# Patient Record
Sex: Male | Born: 1985 | Race: Black or African American | Hispanic: No | Marital: Single | Smoking: Current every day smoker
Health system: Southern US, Community
[De-identification: ages and names within clinical notes are randomized; demographics above are authoritative.]

---

## 2010-07-18 ENCOUNTER — Emergency Department (HOSPITAL_COMMUNITY)
Admission: EM | Admit: 2010-07-18 | Discharge: 2010-07-18 | Payer: Self-pay | Source: Home / Self Care | Admitting: Emergency Medicine

## 2010-10-20 ENCOUNTER — Emergency Department (HOSPITAL_COMMUNITY)
Admission: EM | Admit: 2010-10-20 | Discharge: 2010-10-20 | Disposition: A | Discharge status: Home / Self Care | Payer: Self-pay | Attending: Emergency Medicine | Admitting: Emergency Medicine

## 2010-10-20 DIAGNOSIS — K047 Periapical abscess without sinus: Secondary | ICD-10-CM | POA: Insufficient documentation

## 2010-10-20 DIAGNOSIS — K029 Dental caries, unspecified: Secondary | ICD-10-CM | POA: Insufficient documentation

## 2010-10-20 DIAGNOSIS — K089 Disorder of teeth and supporting structures, unspecified: Secondary | ICD-10-CM | POA: Insufficient documentation

## 2010-12-13 ENCOUNTER — Emergency Department (HOSPITAL_COMMUNITY)
Admission: EM | Admit: 2010-12-13 | Discharge: 2010-12-13 | Disposition: A | Discharge status: Home / Self Care | Payer: Self-pay | Attending: Emergency Medicine | Admitting: Emergency Medicine

## 2010-12-13 DIAGNOSIS — K029 Dental caries, unspecified: Secondary | ICD-10-CM | POA: Insufficient documentation

## 2010-12-13 DIAGNOSIS — K089 Disorder of teeth and supporting structures, unspecified: Secondary | ICD-10-CM | POA: Insufficient documentation

## 2014-10-29 ENCOUNTER — Emergency Department (HOSPITAL_COMMUNITY): Payer: BLUE CROSS/BLUE SHIELD

## 2014-10-29 ENCOUNTER — Encounter (HOSPITAL_COMMUNITY): Payer: Self-pay | Admitting: *Deleted

## 2014-10-29 ENCOUNTER — Emergency Department (HOSPITAL_COMMUNITY)
Admission: EM | Admit: 2014-10-29 | Discharge: 2014-10-29 | Disposition: A | Discharge status: Home / Self Care | Payer: BLUE CROSS/BLUE SHIELD | Attending: Emergency Medicine | Admitting: Emergency Medicine

## 2014-10-29 DIAGNOSIS — R300 Dysuria: Secondary | ICD-10-CM

## 2014-10-29 DIAGNOSIS — A599 Trichomoniasis, unspecified: Secondary | ICD-10-CM

## 2014-10-29 DIAGNOSIS — A5900 Urogenital trichomoniasis, unspecified: Secondary | ICD-10-CM | POA: Diagnosis not present

## 2014-10-29 DIAGNOSIS — N200 Calculus of kidney: Secondary | ICD-10-CM

## 2014-10-29 DIAGNOSIS — Z720 Tobacco use: Secondary | ICD-10-CM | POA: Insufficient documentation

## 2014-10-29 LAB — URINALYSIS, ROUTINE W REFLEX MICROSCOPIC
Bilirubin Urine: NEGATIVE
Glucose, UA: NEGATIVE mg/dL
KETONES UR: NEGATIVE mg/dL
Nitrite: NEGATIVE
PROTEIN: NEGATIVE mg/dL
Specific Gravity, Urine: 1.022 (ref 1.005–1.030)
UROBILINOGEN UA: 0.2 mg/dL (ref 0.0–1.0)
pH: 7 (ref 5.0–8.0)

## 2014-10-29 LAB — COMPREHENSIVE METABOLIC PANEL
ALK PHOS: 96 U/L (ref 39–117)
ALT: 16 U/L (ref 0–53)
AST: 36 U/L (ref 0–37)
Albumin: 4.1 g/dL (ref 3.5–5.2)
Anion gap: 13 (ref 5–15)
BILIRUBIN TOTAL: 1.1 mg/dL (ref 0.3–1.2)
BUN: 13 mg/dL (ref 6–23)
CO2: 23 mmol/L (ref 19–32)
Calcium: 9.6 mg/dL (ref 8.4–10.5)
Chloride: 103 mmol/L (ref 96–112)
Creatinine, Ser: 1.22 mg/dL (ref 0.50–1.35)
GFR, EST NON AFRICAN AMERICAN: 78 mL/min — AB (ref >90)
Glucose, Bld: 92 mg/dL (ref 70–99)
Potassium: 4.9 mmol/L (ref 3.5–5.1)
SODIUM: 139 mmol/L (ref 135–145)
Total Protein: 6.6 g/dL (ref 6.0–8.3)

## 2014-10-29 LAB — CBC WITH DIFFERENTIAL/PLATELET
BASOS ABS: 0.1 10*3/uL (ref 0.0–0.1)
BASOS PCT: 1 % (ref 0–1)
EOS PCT: 3 % (ref 0–5)
Eosinophils Absolute: 0.2 10*3/uL (ref 0.0–0.7)
HEMATOCRIT: 46.4 % (ref 39.0–52.0)
Hemoglobin: 15.6 g/dL (ref 13.0–17.0)
Lymphocytes Relative: 42 % (ref 12–46)
Lymphs Abs: 2.2 10*3/uL (ref 0.7–4.0)
MCH: 31.6 pg (ref 26.0–34.0)
MCHC: 33.6 g/dL (ref 30.0–36.0)
MCV: 93.9 fL (ref 78.0–100.0)
MONO ABS: 0.3 10*3/uL (ref 0.1–1.0)
Monocytes Relative: 5 % (ref 3–12)
NEUTROS ABS: 2.6 10*3/uL (ref 1.7–7.7)
Neutrophils Relative %: 49 % (ref 43–77)
PLATELETS: 218 10*3/uL (ref 150–400)
RBC: 4.94 MIL/uL (ref 4.22–5.81)
RDW: 12.8 % (ref 11.5–15.5)
WBC: 5.3 10*3/uL (ref 4.0–10.5)

## 2014-10-29 LAB — URINE MICROSCOPIC-ADD ON

## 2014-10-29 MED ORDER — AZITHROMYCIN 250 MG PO TABS
1000.0000 mg | ORAL_TABLET | Freq: Once | ORAL | Status: AC
  Administered 2014-10-29: 1000 mg via ORAL
  Filled 2014-10-29: qty 4

## 2014-10-29 MED ORDER — METRONIDAZOLE 500 MG PO TABS: 500.0000 mg | ORAL_TABLET | Freq: Two times a day (BID) | ORAL | Status: DC

## 2014-10-29 MED ORDER — LIDOCAINE HCL (PF) 1 % IJ SOLN
5.0000 mL | Freq: Once | INTRAMUSCULAR | Status: AC
  Administered 2014-10-29: 5 mL
  Filled 2014-10-29: qty 5

## 2014-10-29 MED ORDER — CEPHALEXIN 500 MG PO CAPS: 500.0000 mg | ORAL_CAPSULE | Freq: Four times a day (QID) | ORAL | Status: DC

## 2014-10-29 MED ORDER — CEFTRIAXONE SODIUM 250 MG IJ SOLR
250.0000 mg | Freq: Once | INTRAMUSCULAR | Status: AC
  Administered 2014-10-29: 250 mg via INTRAMUSCULAR
  Filled 2014-10-29: qty 250

## 2014-10-29 NOTE — ED Provider Notes (Signed)
CSN: [id number]     Arrival date & time 10/29/14  1435 History   First MD Initiated Contact with Patient 10/29/14 1521     Chief Complaint  Patient presents with  . Dysuria  . Hematuria     (Consider location/radiation/quality/duration/timing/severity/associated sxs/prior Treatment) HPI Comments: 29 y/o M c/o dysuria and hematuria x 3 days. States he has a sharp pain through his penis that radiates to his suprapubic region and bilateral flank when urinating. Admits to a dull ache intermittently without urination. States he noticed a small amount of blood in his underwear yesterday. Denies testicular pain/swelling, penile discharge. States he is in a monogamous sexual relationship with his girlfriend of 5 months and does not use protection. States STDs are not a concern of his at this time and his symptoms feel like a prior kidney stone. Last kidney stone was 2 years ago. Denies fever, chills, n/v.  The history is provided by the patient.    History reviewed. No pertinent past medical history. History reviewed. No pertinent past surgical history. History reviewed. No pertinent family history. History  Substance Use Topics  . Smoking status: Current Every Day Smoker  . Smokeless tobacco: Never Used  . Alcohol Use: Yes    Review of Systems  Genitourinary: Positive for dysuria and hematuria.  All other systems reviewed and are negative.     Allergies  Review of patient's allergies indicates no known allergies.  Home Medications   Prior to Admission medications   Medication Sig Start Date End Date Taking? Authorizing Provider  cephALEXin (KEFLEX) 500 MG capsule Take 1 capsule (500 mg total) by mouth 4 (four) times daily. 10/29/14   Janica Eldred M Camille Thau, PA-C  metroNIDAZOLE (FLAGYL) 500 MG tablet Take 1 tablet (500 mg total) by mouth 2 (two) times daily. One po bid x 7 days 10/29/14   Katharin Schneider M Lynlee Stratton, PA-C   BP 125/92 mmHg  Pulse 77  Temp(Src) 98.3 F (36.8 C) (Oral)  Resp 16  SpO2  99% Physical Exam  Constitutional: He is oriented to person, place, and time. He appears well-developed and well-nourished. No distress.  HENT:  Head: Normocephalic and atraumatic.  Eyes: Conjunctivae and EOM are normal.  Neck: Normal range of motion. Neck supple.  Cardiovascular: Normal rate, regular rhythm and normal heart sounds.   Pulmonary/Chest: Effort normal and breath sounds normal.  Abdominal: Soft. Bowel sounds are normal. He exhibits no distension. There is no tenderness.  No CVAT.  Genitourinary: Right testis shows no mass, no swelling and no tenderness. Left testis shows no mass, no swelling and no tenderness. Circumcised. No penile erythema or penile tenderness. No discharge found.  Musculoskeletal: Normal range of motion. He exhibits no edema.  Neurological: He is alert and oriented to person, place, and time.  Skin: Skin is warm and dry.  Psychiatric: He has a normal mood and affect. His behavior is normal.  Nursing note and vitals reviewed.   ED Course  Procedures (including critical care time) Labs Review Labs Reviewed  URINALYSIS, ROUTINE W REFLEX MICROSCOPIC - Abnormal; Notable for the following:    APPearance CLOUDY (*)    Hgb urine dipstick MODERATE (*)    Leukocytes, UA MODERATE (*)    All other components within normal limits  COMPREHENSIVE METABOLIC PANEL - Abnormal; Notable for the following:    GFR calc non Af Amer 78 (*)    All other components within normal limits  URINE MICROSCOPIC-ADD ON - Abnormal; Notable for the following:    Bacteria,  UA MANY (*)    All other components within normal limits  URINE CULTURE  CBC WITH DIFFERENTIAL/PLATELET  GC/CHLAMYDIA PROBE AMP (Nunez)    Imaging Review Ct Abdomen Pelvis Wo Contrast  10/29/2014   CLINICAL DATA:  Pain during urination for 3 days.  EXAM: CT ABDOMEN AND PELVIS WITHOUT CONTRAST  TECHNIQUE: Multidetector CT imaging of the abdomen and pelvis was performed following the standard protocol  without IV contrast.  COMPARISON:  None.  FINDINGS: BODY WALL: No contributory findings.  LOWER CHEST: No contributory findings.  ABDOMEN/PELVIS:  Liver: No focal abnormality.  Biliary: No evidence of biliary obstruction or stone.  Pancreas: Unremarkable.  Spleen: Unremarkable.  Adrenals: Unremarkable.  Kidneys and ureters: Punctate stone present in the upper pole right kidney. No hydronephrosis or ureteral calculus.  Bladder: Unremarkable.  Reproductive: No pathologic findings.  Bowel: Few colonic diverticula without active inflammation. No bowel obstruction. Normal appendix.  Retroperitoneum: No mass or adenopathy.  Peritoneum: No ascites or pneumoperitoneum.  Vascular: No acute abnormality.  OSSEOUS: No acute abnormalities.  IMPRESSION: 1. No explanation for dysuria. 2. Punctate right nephrolithiasis. 3. Few colonic diverticula.   Electronically Signed   By: Marnee Spring M.D.   On: 10/29/2014 17:24     EKG Interpretation None      MDM   Final diagnoses:  Dysuria  Trichomonas infection  Right nephrolithiasis   Nontoxic appearing, NAD. AF VSS. Abdomen soft and nontender. GU exam normal. CT scan obtained to evaluate for possible obstructing stones and history of kidney stones. CT scan showing punctate right nephrolithiasis. CBC, CMP obtained prior to patient being seen, ordered by triage. No acute findings. Urinalysis with 7-10 white blood cells, many bacteria, Trichomonas present, moderate leukocytes. Discussed this with patient, advised that this is a sexually transmitted disease, and he is required to tell his partner to receive treatment. Also prophylactically treated for GC/chlamydia with Rocephin and azithromycin. Discharge home with Rx Keflex and Flagyl. Infection care/precautions discussed. Stable for discharge. Return precautions given. Patient states understanding of treatment care plan and is agreeable.  Kathrynn Speed, PA-C 10/29/14 1743  Azalia Bilis, MD 10/30/14 [phone]

## 2014-10-29 NOTE — ED Notes (Signed)
Pt reports dysuria and hematuria starting three days ago. Pt reports hx of kidney stones. Pt reports bilateral lower abdominal pain.

## 2014-10-29 NOTE — Discharge Instructions (Signed)
Take Keflex and Flagyl as directed for 1 week. You were treated today for sexually transmitted diseases. You are required to tell your partner that you have a trichomonas infection so she can receive treatment.  Trichomoniasis Trichomoniasis is an infection caused by an organism called Trichomonas. The infection can affect both women and men. In women, the outer male genitalia and the vagina are affected. In men, the penis is mainly affected, but the prostate and other reproductive organs can also be involved. Trichomoniasis is a sexually transmitted infection (STI) and is most often passed to another person through sexual contact.  RISK FACTORS  Having unprotected sexual intercourse.  Having sexual intercourse with an infected partner. SIGNS AND SYMPTOMS  Symptoms of trichomoniasis in women include:  Abnormal gray-green frothy vaginal discharge.  Itching and irritation of the vagina.  Itching and irritation of the area outside the vagina. Symptoms of trichomoniasis in men include:   Penile discharge with or without pain.  Pain during urination. This results from inflammation of the urethra. DIAGNOSIS  Trichomoniasis may be found during a Pap test or physical exam. Your health care provider may use one of the following methods to help diagnose this infection:  Examining vaginal discharge under a microscope. For men, urethral discharge would be examined.  Testing the pH of the vagina with a test tape.  Using a vaginal swab test that checks for the Trichomonas organism. A test is available that provides results within a few minutes.  Doing a culture test for the organism. This is not usually needed. TREATMENT   You may be given medicine to fight the infection. Women should inform their health care provider if they could be or are pregnant. Some medicines used to treat the infection should not be taken during pregnancy.  Your health care provider may recommend over-the-counter  medicines or creams to decrease itching or irritation.  Your sexual partner will need to be treated if infected. HOME CARE INSTRUCTIONS   Take medicines only as directed by your health care provider.  Take over-the-counter medicine for itching or irritation as directed by your health care provider.  Do not have sexual intercourse while you have the infection.  Women should not douche or wear tampons while they have the infection.  Discuss your infection with your partner. Your partner may have gotten the infection from you, or you may have gotten it from your partner.  Have your sex partner get examined and treated if necessary.  Practice safe, informed, and protected sex.  See your health care provider for other STI testing. SEEK MEDICAL CARE IF:   You still have symptoms after you finish your medicine.  You develop abdominal pain.  You have pain when you urinate.  You have bleeding after sexual intercourse.  You develop a rash.  Your medicine makes you sick or makes you throw up (vomit). MAKE SURE YOU:  Understand these instructions.  Will watch your condition.  Will get help right away if you are not doing well or get worse. Document Released: 01/10/2001 Document Revised: 12/01/2013 Document Reviewed: 04/28/2013 [facility] Patient Information 2015 [place], [place]. This information is not intended to replace advice given to you by your health care provider. Make sure you discuss any questions you have with your health care provider.  Sexually Transmitted Disease A sexually transmitted disease (STD) is a disease or infection that may be passed (transmitted) from person to person, usually during sexual activity. This may happen by way of saliva, semen, blood, vaginal mucus,  or urine. Common STDs include:   Gonorrhea.   Chlamydia.   Syphilis.   HIV and AIDS.   Genital herpes.   Hepatitis B and C.   Trichomonas.   Human papillomavirus (HPV).   Pubic  lice.   Scabies.  Mites.  Bacterial vaginosis. WHAT ARE CAUSES OF STDs? An STD may be caused by bacteria, a virus, or parasites. STDs are often transmitted during sexual activity if one person is infected. However, they may also be transmitted through nonsexual means. STDs may be transmitted after:   Sexual intercourse with an infected person.   Sharing sex toys with an infected person.   Sharing needles with an infected person or using unclean piercing or tattoo needles.  Having intimate contact with the genitals, mouth, or rectal areas of an infected person.   Exposure to infected fluids during birth. WHAT ARE THE SIGNS AND SYMPTOMS OF STDs? Different STDs have different symptoms. Some people may not have any symptoms. If symptoms are present, they may include:   Painful or bloody urination.   Pain in the pelvis, abdomen, vagina, anus, throat, or eyes.   A skin rash, itching, or irritation.  Growths, ulcerations, blisters, or sores in the genital and anal areas.  Abnormal vaginal discharge with or without bad odor.   Penile discharge in men.   Fever.   Pain or bleeding during sexual intercourse.   Swollen glands in the groin area.   Yellow skin and eyes (jaundice). This is seen with hepatitis.   Swollen testicles.  Infertility.  Sores and blisters in the mouth. HOW ARE STDs DIAGNOSED? To make a diagnosis, your health care provider may:   Take a medical history.   Perform a physical exam.   Take a sample of any discharge to examine.  Swab the throat, cervix, opening to the penis, rectum, or vagina for testing.  Test a sample of your first morning urine.   Perform blood tests.   Perform a Pap test, if this applies.   Perform a colposcopy.   Perform a laparoscopy.  HOW ARE STDs TREATED? Treatment depends on the STD. Some STDs may be treated but not cured.   Chlamydia, gonorrhea, trichomonas, and syphilis can be cured with  antibiotic medicine.   Genital herpes, hepatitis, and HIV can be treated, but not cured, with prescribed medicines. The medicines lessen symptoms.   Genital warts from HPV can be treated with medicine or by freezing, burning (electrocautery), or surgery. Warts may come back.   HPV cannot be cured with medicine or surgery. However, abnormal areas may be removed from the cervix, vagina, or vulva.   If your diagnosis is confirmed, your recent sexual partners need treatment. This is true even if they are symptom-free or have a negative culture or evaluation. They should not have sex until their health care providers say it is okay. HOW CAN I REDUCE MY RISK OF GETTING AN STD? Take these steps to reduce your risk of getting an STD:  Use latex condoms, dental dams, and water-soluble lubricants during sexual activity. Do not use petroleum jelly or oils.  Avoid having multiple sex partners.  Do not have sex with someone who has other sex partners.  Do not have sex with anyone you do not know or who is at high risk for an STD.  Avoid risky sex practices that can break your skin.  Do not have sex if you have open sores on your mouth or skin.  Avoid drinking too much alcohol  or taking illegal drugs. Alcohol and drugs can affect your judgment and put you in a vulnerable position.  Avoid engaging in oral and anal sex acts.  Get vaccinated for HPV and hepatitis. If you have not received these vaccines in the past, talk to your health care provider about whether one or both might be right for you.   If you are at risk of being infected with HIV, it is recommended that you take a prescription medicine daily to prevent HIV infection. This is called pre-exposure prophylaxis (PrEP). You are considered at risk if:  You are a man who has sex with other men (MSM).  You are a heterosexual man or woman and are sexually active with more than one partner.  You take drugs by injection.  You are  sexually active with a partner who has HIV.  Talk with your health care provider about whether you are at high risk of being infected with HIV. If you choose to begin PrEP, you should first be tested for HIV. You should then be tested every 3 months for as long as you are taking PrEP.  WHAT SHOULD I DO IF I THINK I HAVE AN STD?  See your health care provider.   Tell your sexual partner(s). They should be tested and treated for any STDs.  Do not have sex until your health care provider says it is okay. WHEN SHOULD I GET IMMEDIATE MEDICAL CARE? Contact your health care provider right away if:   You have severe abdominal pain.  You are a man and notice swelling or pain in your testicles.  You are a woman and notice swelling or pain in your vagina. Document Released: 10/07/2002 Document Revised: 07/22/2013 Document Reviewed: 02/04/2013 [facility] Patient Information 2015 [place], [place]. This information is not intended to replace advice given to you by your health care provider. Make sure you discuss any questions you have with your health care provider.  Kidney Stones Kidney stones (urolithiasis) are deposits that form inside your kidneys. The intense pain is caused by the stone moving through the urinary tract. When the stone moves, the ureter goes into spasm around the stone. The stone is usually passed in the urine.  CAUSES   A disorder that makes certain neck glands produce too much parathyroid hormone (primary hyperparathyroidism).  A buildup of uric acid crystals, similar to gout in your joints.  Narrowing (stricture) of the ureter.  A kidney obstruction present at birth (congenital obstruction).  Previous surgery on the kidney or ureters.  Numerous kidney infections. SYMPTOMS   Feeling sick to your stomach (nauseous).  Throwing up (vomiting).  Blood in the urine (hematuria).  Pain that usually spreads (radiates) to the groin.  Frequency or urgency of  urination. DIAGNOSIS   Taking a history and physical exam.  Blood or urine tests.  CT scan.  Occasionally, an examination of the inside of the urinary bladder (cystoscopy) is performed. TREATMENT   Observation.  Increasing your fluid intake.  Extracorporeal shock wave lithotripsy--This is a noninvasive procedure that uses shock waves to break up kidney stones.  Surgery may be needed if you have severe pain or persistent obstruction. There are various surgical procedures. Most of the procedures are performed with the use of small instruments. Only small incisions are needed to accommodate these instruments, so recovery time is minimized. The size, location, and chemical composition are all important variables that will determine the proper choice of action for you. Talk to your health care provider to  better understand your situation so that you will minimize the risk of injury to yourself and your kidney.  HOME CARE INSTRUCTIONS   Drink enough water and fluids to keep your urine clear or pale yellow. This will help you to pass the stone or stone fragments.  Strain all urine through the provided strainer. Keep all particulate matter and stones for your health care provider to see. The stone causing the pain may be as small as a grain of salt. It is very important to use the strainer each and every time you pass your urine. The collection of your stone will allow your health care provider to analyze it and verify that a stone has actually passed. The stone analysis will often identify what you can do to reduce the incidence of recurrences.  Only take over-the-counter or prescription medicines for pain, discomfort, or fever as directed by your health care provider.  Make a follow-up appointment with your health care provider as directed.  Get follow-up X-rays if required. The absence of pain does not always mean that the stone has passed. It may have only stopped moving. If the urine  remains completely obstructed, it can cause loss of kidney function or even complete destruction of the kidney. It is your responsibility to make sure X-rays and follow-ups are completed. Ultrasounds of the kidney can show blockages and the status of the kidney. Ultrasounds are not associated with any radiation and can be performed easily in a matter of minutes. SEEK MEDICAL CARE IF:  You experience pain that is progressive and unresponsive to any pain medicine you have been prescribed. SEEK IMMEDIATE MEDICAL CARE IF:   Pain cannot be controlled with the prescribed medicine.  You have a fever or shaking chills.  The severity or intensity of pain increases over 18 hours and is not relieved by pain medicine.  You develop a new onset of abdominal pain.  You feel faint or pass out.  You are unable to urinate. MAKE SURE YOU:   Understand these instructions.  Will watch your condition.  Will get help right away if you are not doing well or get worse. Document Released: 07/17/2005 Document Revised: 03/19/2013 Document Reviewed: 12/18/2012 [facility] Patient Information 2015 [place], [place]. This information is not intended to replace advice given to you by your health care provider. Make sure you discuss any questions you have with your health care provider.

## 2014-10-30 LAB — GC/CHLAMYDIA PROBE AMP (~~LOC~~) NOT AT ARMC
CHLAMYDIA, DNA PROBE: NEGATIVE
Neisseria Gonorrhea: POSITIVE — AB

## 2014-10-31 LAB — URINE CULTURE
COLONY COUNT: NO GROWTH
Culture: NO GROWTH

## 2014-11-02 ENCOUNTER — Telehealth (HOSPITAL_COMMUNITY): Payer: Self-pay

## 2014-11-02 NOTE — ED Notes (Signed)
Spoke with pt. Positive for gonorrhea. Treated per protocol. Informed. Advised to abstain from sexual activity x 10days and to notify partner(s) for testing and treatment.

## 2016-02-21 IMAGING — CT CT ABD-PELV W/O CM
2 of 4 series · 16 of 46 positions shown, 18 images · non-contrast
Comparison: None.

CLINICAL DATA: Pain during urination for 3 days.

EXAM:
CT ABDOMEN AND PELVIS WITHOUT CONTRAST
TECHNIQUE: Multidetector CT imaging of the abdomen and pelvis was performed
following the standard protocol without IV contrast.

[Series 2: abd/ pelvis 5.0 i30f 1 · axial · 0.68mm/px · z∈[-456,-31]mm · 13 of 93 slices shown, 15 images]
[im 4/93  soft-tissue]
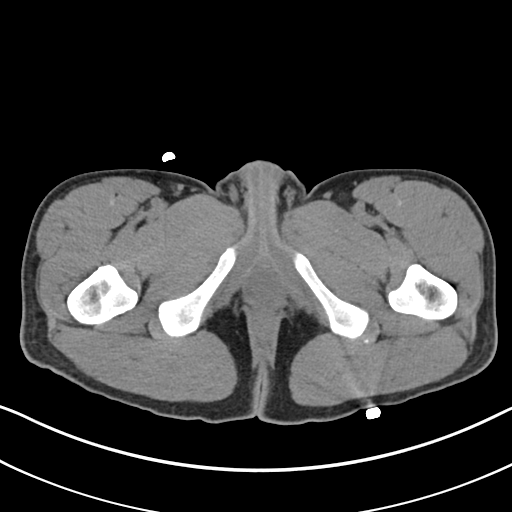
[im 4/93  bone]
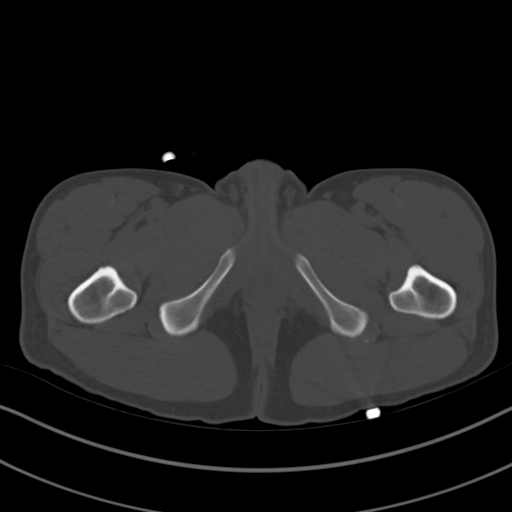
[im 12/93  soft-tissue]
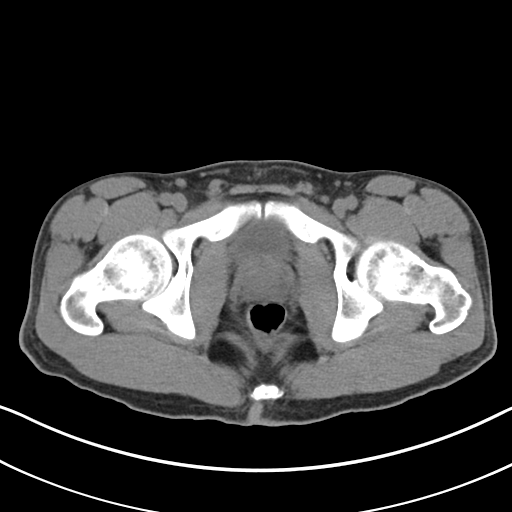
[im 20/93  soft-tissue]
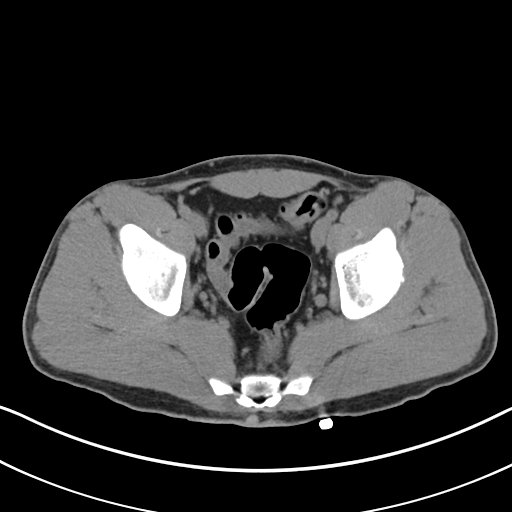
[im 27/93  soft-tissue]
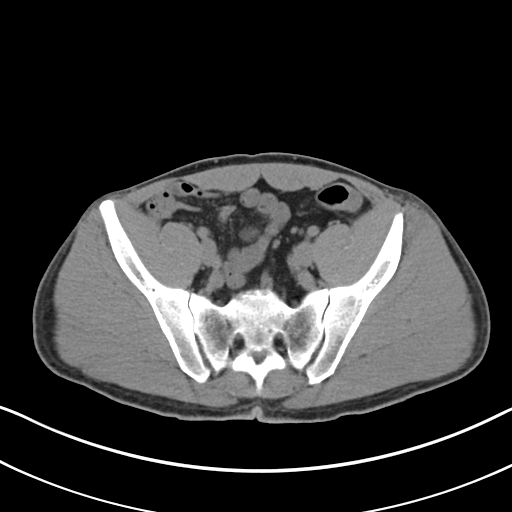
[im 31/93  soft-tissue]
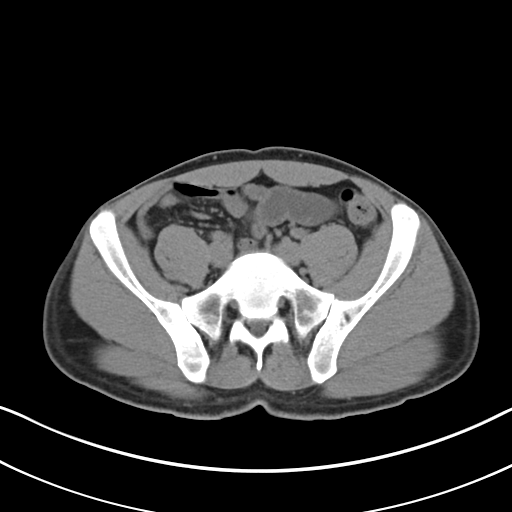
[im 39/93  soft-tissue]
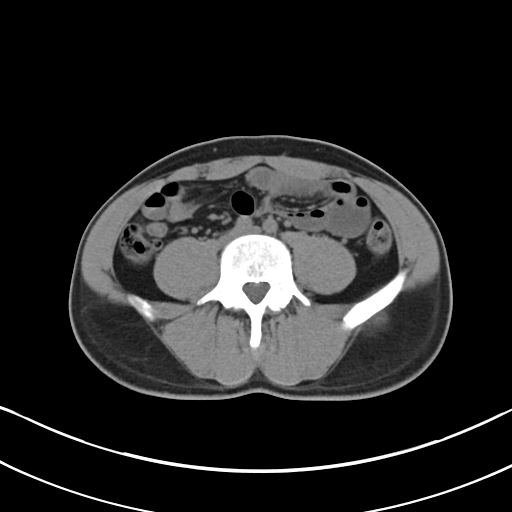
[im 47/93  soft-tissue]
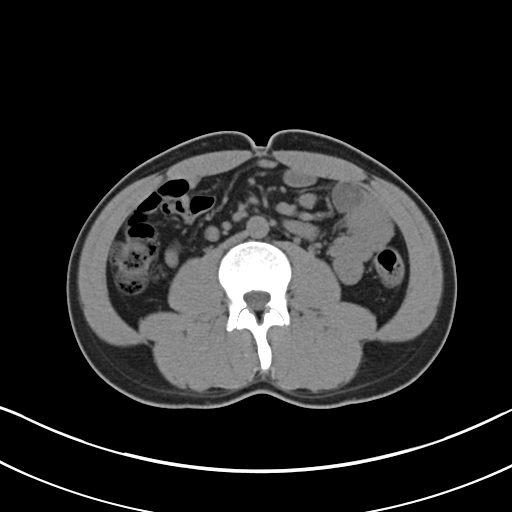
[im 54/93  soft-tissue]
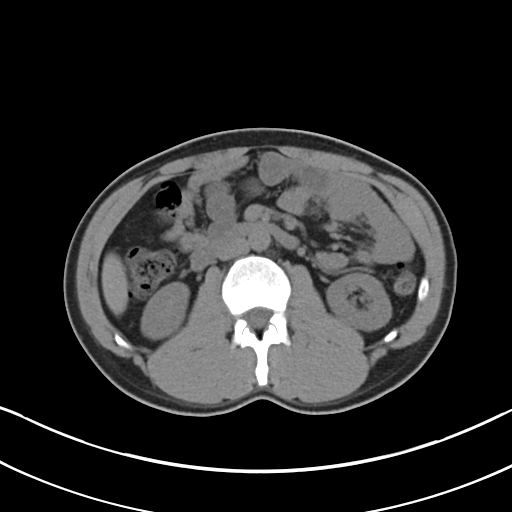
[im 62/93  soft-tissue]
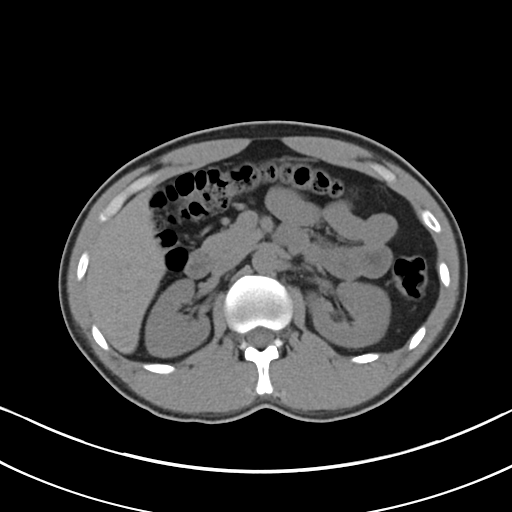
[im 62/93  bone]
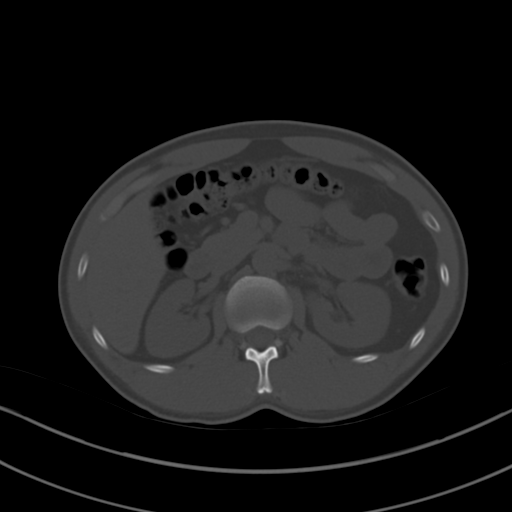
[im 66/93  soft-tissue]
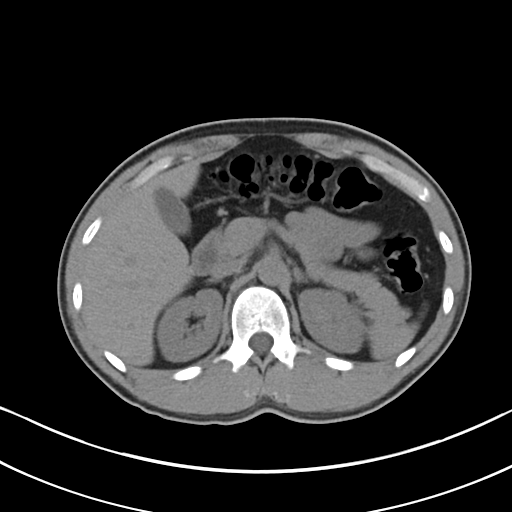
[im 73/93  soft-tissue]
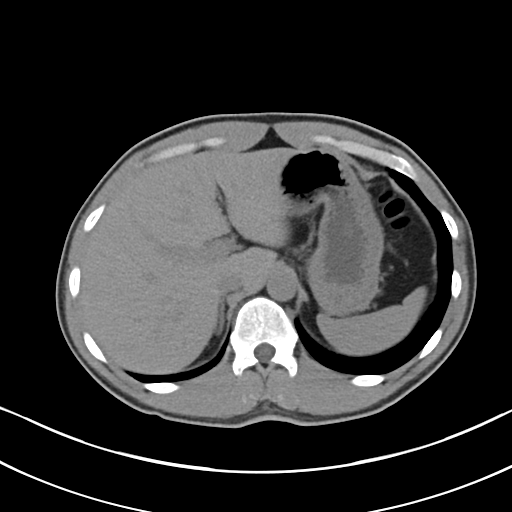
[im 81/93  soft-tissue]
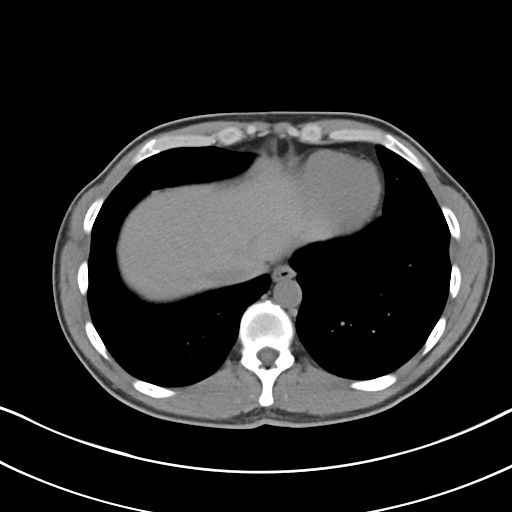
[im 89/93  soft-tissue]
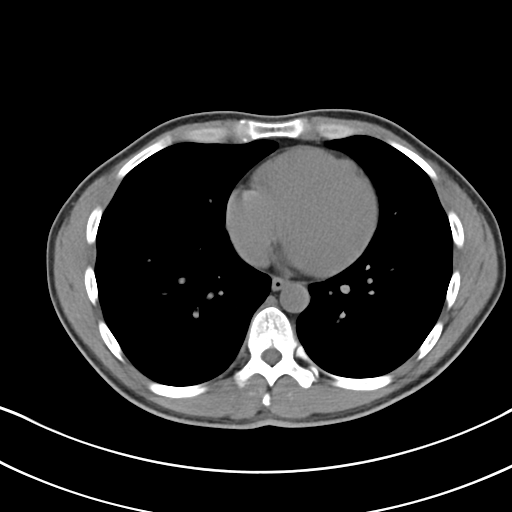

[Series 5: coronals · coronal · 0.70mm/px · 3 of 118 slices shown]
[im 40/118  soft-tissue]
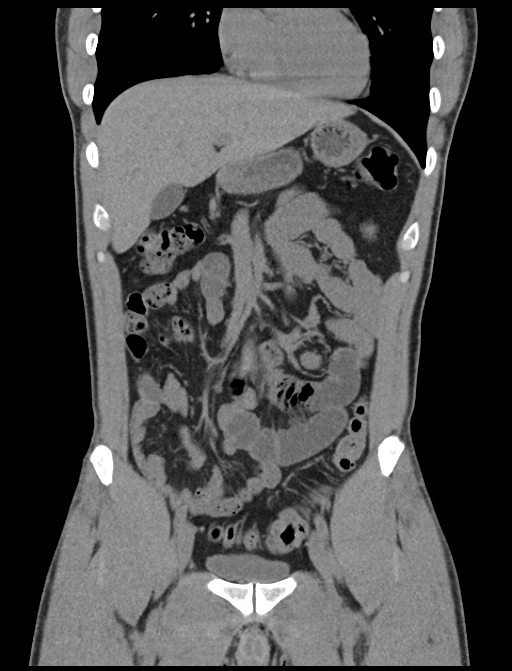
[im 53/118  soft-tissue]
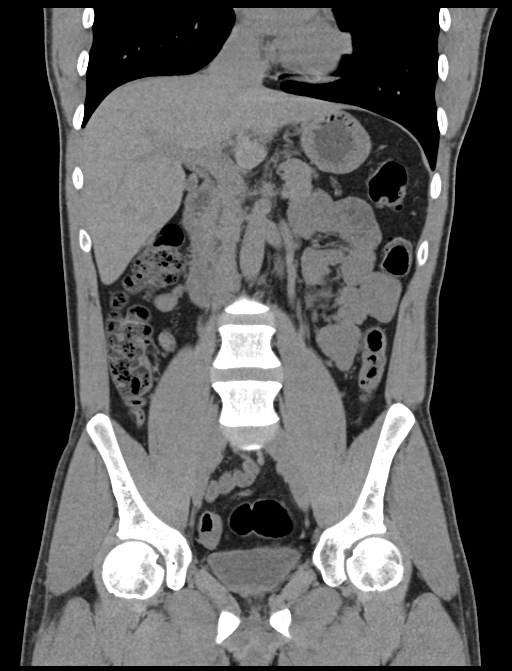
[im 66/118  soft-tissue]
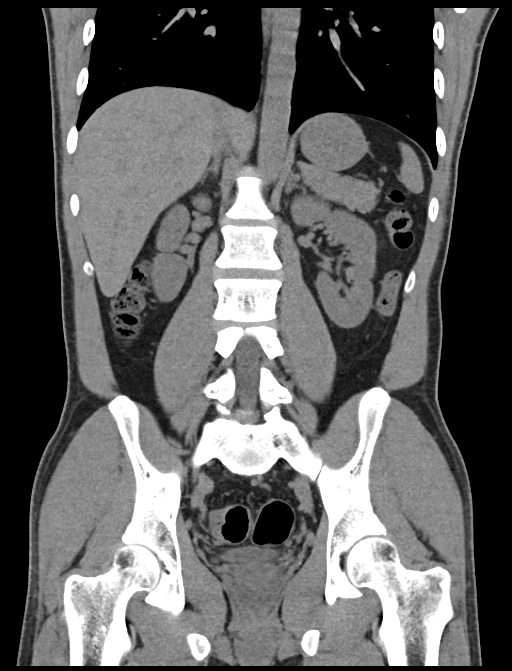

[16 of 46 positions shown; findings below may reference images not displayed]

FINDINGS: BODY WALL: No contributory findings.

LOWER CHEST: No contributory findings.

ABDOMEN/PELVIS:

Liver: No focal abnormality.

Biliary: No evidence of biliary obstruction or stone.

Pancreas: Unremarkable.

Spleen: Unremarkable.

Adrenals: Unremarkable.

Kidneys and ureters: Punctate stone present in the upper pole right
kidney. No hydronephrosis or ureteral calculus.

Bladder: Unremarkable.

Reproductive: No pathologic findings.

Bowel: Few colonic diverticula without active inflammation. No bowel
obstruction. Normal appendix.

Retroperitoneum: No mass or adenopathy.

Peritoneum: No ascites or pneumoperitoneum.

Vascular: No acute abnormality.

OSSEOUS: No acute abnormalities.
IMPRESSION: 1. No explanation for dysuria.
2. Punctate right nephrolithiasis.
3. Few colonic diverticula.

## 2016-10-28 ENCOUNTER — Emergency Department (HOSPITAL_COMMUNITY)
Admission: EM | Admit: 2016-10-28 | Discharge: 2016-10-28 | Disposition: A | Discharge status: Home / Self Care | Payer: BLUE CROSS/BLUE SHIELD | Attending: Emergency Medicine | Admitting: Emergency Medicine

## 2016-10-28 ENCOUNTER — Emergency Department (HOSPITAL_COMMUNITY): Payer: BLUE CROSS/BLUE SHIELD

## 2016-10-28 ENCOUNTER — Encounter (HOSPITAL_COMMUNITY): Payer: Self-pay | Admitting: Oncology

## 2016-10-28 DIAGNOSIS — Y939 Activity, unspecified: Secondary | ICD-10-CM | POA: Insufficient documentation

## 2016-10-28 DIAGNOSIS — S0083XA Contusion of other part of head, initial encounter: Secondary | ICD-10-CM

## 2016-10-28 DIAGNOSIS — Y999 Unspecified external cause status: Secondary | ICD-10-CM | POA: Insufficient documentation

## 2016-10-28 DIAGNOSIS — R51 Headache: Secondary | ICD-10-CM | POA: Insufficient documentation

## 2016-10-28 DIAGNOSIS — Y929 Unspecified place or not applicable: Secondary | ICD-10-CM | POA: Insufficient documentation

## 2016-10-28 DIAGNOSIS — Z23 Encounter for immunization: Secondary | ICD-10-CM | POA: Insufficient documentation

## 2016-10-28 DIAGNOSIS — S0101XA Laceration without foreign body of scalp, initial encounter: Secondary | ICD-10-CM | POA: Insufficient documentation

## 2016-10-28 DIAGNOSIS — F17200 Nicotine dependence, unspecified, uncomplicated: Secondary | ICD-10-CM | POA: Insufficient documentation

## 2016-10-28 MED ORDER — TETANUS-DIPHTH-ACELL PERTUSSIS 5-2.5-18.5 LF-MCG/0.5 IM SUSP
0.5000 mL | Freq: Once | INTRAMUSCULAR | Status: AC
  Administered 2016-10-28: 0.5 mL via INTRAMUSCULAR
  Filled 2016-10-28: qty 0.5

## 2016-10-28 MED ORDER — IBUPROFEN 600 MG PO TABS: 600.0000 mg | ORAL_TABLET | Freq: Four times a day (QID) | ORAL | 0 refills | Status: DC | PRN

## 2016-10-28 MED ORDER — OXYCODONE-ACETAMINOPHEN 5-325 MG PO TABS
1.0000 | ORAL_TABLET | Freq: Once | ORAL | Status: AC
  Administered 2016-10-28: 1 via ORAL
  Filled 2016-10-28: qty 1

## 2016-10-28 MED ORDER — OXYCODONE-ACETAMINOPHEN 5-325 MG PO TABS: 1.0000 | ORAL_TABLET | ORAL | 0 refills | Status: DC | PRN

## 2016-10-28 NOTE — ED Notes (Addendum)
Pt has created password of, "[email or web address]."

## 2016-10-28 NOTE — ED Notes (Signed)
Pt OOB independently.  Pt is ambulating w/ a steady gait.  Denies dizziness.
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
## 2016-10-28 NOTE — Discharge Instructions (Signed)
Staple will need to be removed in 10 days. You can return here or go to any Urgent Care.
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
## 2016-11-07 ENCOUNTER — Encounter (HOSPITAL_COMMUNITY): Payer: Self-pay

## 2016-11-07 ENCOUNTER — Emergency Department (HOSPITAL_COMMUNITY)
Admission: EM | Admit: 2016-11-07 | Discharge: 2016-11-07 | Disposition: A | Discharge status: Home / Self Care | Payer: BLUE CROSS/BLUE SHIELD | Attending: Emergency Medicine | Admitting: Emergency Medicine

## 2016-11-07 DIAGNOSIS — Z4802 Encounter for removal of sutures: Secondary | ICD-10-CM

## 2016-11-07 DIAGNOSIS — F17200 Nicotine dependence, unspecified, uncomplicated: Secondary | ICD-10-CM | POA: Insufficient documentation

## 2016-11-07 DIAGNOSIS — Z79899 Other long term (current) drug therapy: Secondary | ICD-10-CM | POA: Insufficient documentation

## 2016-11-07 MED ORDER — LIDOCAINE HCL (PF) 1 % IJ SOLN: 5.0000 mL | Freq: Once | INTRAMUSCULAR | Status: DC
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
## 2016-11-07 NOTE — ED Notes (Signed)
NP Hope at the bedside. Removing suture

## 2016-11-07 NOTE — ED Triage Notes (Signed)
Pt here to have staples removed from head. Denies any other symptoms.

## 2017-05-09 ENCOUNTER — Emergency Department (HOSPITAL_COMMUNITY)
Admission: EM | Admit: 2017-05-09 | Discharge: 2017-05-09 | Disposition: A | Discharge status: Home / Self Care | Payer: BLUE CROSS/BLUE SHIELD | Attending: Emergency Medicine | Admitting: Emergency Medicine

## 2017-05-09 ENCOUNTER — Encounter (HOSPITAL_COMMUNITY): Payer: Self-pay

## 2017-05-09 DIAGNOSIS — F17200 Nicotine dependence, unspecified, uncomplicated: Secondary | ICD-10-CM | POA: Insufficient documentation

## 2017-05-09 DIAGNOSIS — M545 Low back pain, unspecified: Secondary | ICD-10-CM

## 2017-05-09 DIAGNOSIS — X500XXA Overexertion from strenuous movement or load, initial encounter: Secondary | ICD-10-CM | POA: Insufficient documentation

## 2017-05-09 DIAGNOSIS — R197 Diarrhea, unspecified: Secondary | ICD-10-CM | POA: Insufficient documentation

## 2017-05-09 DIAGNOSIS — R1032 Left lower quadrant pain: Secondary | ICD-10-CM | POA: Insufficient documentation

## 2017-05-09 LAB — COMPREHENSIVE METABOLIC PANEL
ALBUMIN: 4.4 g/dL (ref 3.5–5.0)
ALT: 10 U/L — ABNORMAL LOW (ref 17–63)
AST: 29 U/L (ref 15–41)
Alkaline Phosphatase: 100 U/L (ref 38–126)
Anion gap: 10 (ref 5–15)
BUN: 9 mg/dL (ref 6–20)
CHLORIDE: 103 mmol/L (ref 101–111)
CO2: 26 mmol/L (ref 22–32)
Calcium: 9.5 mg/dL (ref 8.9–10.3)
Creatinine, Ser: 1.21 mg/dL (ref 0.61–1.24)
GFR calc Af Amer: >60 mL/min (ref >60)
GFR calc non Af Amer: >60 mL/min (ref >60)
GLUCOSE: 108 mg/dL — AB (ref 65–99)
POTASSIUM: 4.6 mmol/L (ref 3.5–5.1)
SODIUM: 139 mmol/L (ref 135–145)
Total Bilirubin: 1.5 mg/dL — ABNORMAL HIGH (ref 0.3–1.2)
Total Protein: 7.1 g/dL (ref 6.5–8.1)

## 2017-05-09 LAB — URINALYSIS, ROUTINE W REFLEX MICROSCOPIC
Bacteria, UA: NONE SEEN
Bilirubin Urine: NEGATIVE
GLUCOSE, UA: NEGATIVE mg/dL
HGB URINE DIPSTICK: NEGATIVE
Ketones, ur: NEGATIVE mg/dL
Nitrite: NEGATIVE
Protein, ur: NEGATIVE mg/dL
SPECIFIC GRAVITY, URINE: 1.019 (ref 1.005–1.030)
pH: 7 (ref 5.0–8.0)

## 2017-05-09 LAB — CBC
HCT: 44.8 % (ref 39.0–52.0)
Hemoglobin: 15.6 g/dL (ref 13.0–17.0)
MCH: 33.3 pg (ref 26.0–34.0)
MCHC: 34.8 g/dL (ref 30.0–36.0)
MCV: 95.7 fL (ref 78.0–100.0)
Platelets: 226 10*3/uL (ref 150–400)
RBC: 4.68 MIL/uL (ref 4.22–5.81)
RDW: 12.6 % (ref 11.5–15.5)
WBC: 4.7 10*3/uL (ref 4.0–10.5)

## 2017-05-09 LAB — LIPASE, BLOOD: LIPASE: 26 U/L (ref 11–51)

## 2017-05-09 MED ORDER — IBUPROFEN 400 MG PO TABS
600.0000 mg | ORAL_TABLET | Freq: Once | ORAL | Status: AC
  Administered 2017-05-09: 600 mg via ORAL
  Filled 2017-05-09: qty 1

## 2017-05-09 NOTE — ED Notes (Signed)
Patient presents to ED c/o lower back pain onset Sat. Denies injury, states he does a lot of lifting at work.

## 2017-05-09 NOTE — ED Triage Notes (Signed)
Patient complains of left flank and side pain with diarrhea x 4 days. No nausea, no vomiting. Denies increased pain with movement, no trauma

## 2017-05-09 NOTE — ED Provider Notes (Signed)
[facility] Provider Note   CSN: [id number] Arrival date & time: 05/09/17  4098     History   Chief Complaint No chief complaint on file.   HPI Gary Ray is a 31 y.o. male.  The history is provided by the patient.  Back Pain   This is a new problem. The current episode started more than 2 days ago. The problem occurs constantly. The problem has not changed since onset.The pain is associated with lifting heavy objects. The pain is present in the lumbar spine. The quality of the pain is described as aching. The pain does not radiate. The pain is moderate. The symptoms are aggravated by bending, twisting and certain positions. The pain is the same all the time. Pertinent negatives include no chest pain, no fever, no numbness, no abdominal pain, no bowel incontinence, no perianal numbness, no bladder incontinence, no leg pain, no paresthesias and no weakness. He has tried nothing for the symptoms. The treatment provided no relief.   31 year old male who presents with low back pain. He is noticed gradual onset of symptoms 4 days ago, worse with movement and some position changes. He denies injury or fall. He denies abdominal pain, nausea vomiting, diarrhea, dysuria, urinary frequency, hematuria, fevers or chills. No lower extremity numbness or weakness, bowel or urinary incontinence or urinary retention. States that he works in [employer], and does a significant amount of heavy lifting throughout the week. Has not tried any medications for his symptoms.  History reviewed. No pertinent past medical history.  There are no active problems to display for this patient.   History reviewed. No pertinent surgical history.     Home Medications    Prior to Admission medications   Medication Sig Start Date End Date Taking? Authorizing Provider  ibuprofen (ADVIL,MOTRIN) 600 MG tablet Take 1 tablet (600 mg total) by mouth every 6 (six) hours as needed. 10/28/16   Elpidio Anis, PA-C    oxyCODONE-acetaminophen (PERCOCET/ROXICET) 5-325 MG tablet Take 1 tablet by mouth every 4 (four) hours as needed for severe pain. 10/28/16   Elpidio Anis, PA-C    Family History No family history on file.  Social History Social History  Substance Use Topics  . Smoking status: Current Every Day Smoker  . Smokeless tobacco: Never Used  . Alcohol use Yes     Allergies   Patient has no known allergies.   Review of Systems Review of Systems  Constitutional: Negative for fever.  Respiratory: Negative for shortness of breath.   Cardiovascular: Negative for chest pain.  Gastrointestinal: Negative for abdominal pain and bowel incontinence.  Genitourinary: Negative for bladder incontinence.  Musculoskeletal: Positive for back pain.  Allergic/Immunologic: Negative for immunocompromised state.  Neurological: Negative for weakness, numbness and paresthesias.  Hematological: Does not bruise/bleed easily.  All other systems reviewed and are negative.    Physical Exam Updated Vital Signs BP (!) 141/99 (BP Location: Right Arm)   Pulse (!) 57   Temp 98.2 F (36.8 C) (Oral)   Resp 14   Ht  (1.676 m)   Wt 68 kg (150 lb)   SpO2 100%   BMI 24.21 kg/m   Physical Exam Physical Exam  Nursing note and vitals reviewed. Constitutional: Well developed, well nourished, non-toxic, and in no acute distress Head: Normocephalic and atraumatic.  Mouth/Throat: Oropharynx is clear and moist.  Neck: Normal range of motion. Neck supple.  Cardiovascular: Normal rate and regular rhythm.   Pulmonary/Chest: Effort normal and breath sounds normal.  Abdominal: Soft. There is no tenderness. There is no rebound and no guarding.  Musculoskeletal: Normal range of motion. Mild low lumbar tenderness over paraspinal muscles bilaterally Neurological: Alert, no facial droop, fluent speech, moves all extremities symmetrically,  full-strength and sensation bilateral lower extremities Skin: Skin is warm  and dry.  Psychiatric: Cooperative   ED Treatments / Results  Labs (all labs ordered are listed, but only abnormal results are displayed) Labs Reviewed  COMPREHENSIVE METABOLIC PANEL - Abnormal; Notable for the following:       Result Value   Glucose, Bld 108 (*)    ALT 10 (*)    Total Bilirubin 1.5 (*)    All other components within normal limits  URINALYSIS, ROUTINE W REFLEX MICROSCOPIC - Abnormal; Notable for the following:    Leukocytes, UA SMALL (*)    Squamous Epithelial / LPF 0-5 (*)    All other components within normal limits  URINE CULTURE  LIPASE, BLOOD  CBC    EKG  EKG Interpretation None       Radiology No results found.  Procedures Procedures (including critical care time)  Medications Ordered in ED Medications  ibuprofen (ADVIL,MOTRIN) tablet 600 mg (not administered)     Initial Impression / Assessment and Plan / ED Course  I have reviewed the triage vital signs and the nursing notes.  Pertinent labs & imaging results that were available during my care of the patient were reviewed by me and considered in my medical decision making (see chart for details).     31 year old male who presents with low back pain, felt to be likely muscle strain in the setting of heavy lifting on his job. He is well-appearing in no acute distress. Abdomen is soft and benign. No significant CVA tenderness. Neurovascularly intact in bilateral lower extremities.   He did get blood work and urinalysis performed in triage. Blood work is reassuring. Normal renal function. No evidence of blood on the UA. There are numerous wbc's and small leukocytes, but he is not having any urinary symptoms and his presentation is not concerning for pyelonephritis or UTI.   I discussed supportive care management for now with ibuprofen, heat packs, and rest.  Strict return and follow-up instructions reviewed. He expressed understanding of all discharge instructions and felt comfortable with the  plan of care.  Final Clinical Impressions(s) / ED Diagnoses   Final diagnoses:  Acute midline low back pain without sciatica    New Prescriptions New Prescriptions   No medications on file     Lavera Guise, MD 05/09/17 1005

## 2017-05-09 NOTE — Discharge Instructions (Signed)
Your back pain is likely related to muscle strain from heavy lifting.  Take ibuprofen and tylenol for pain. Use heat backs. Avoid heavy lifting or staying bedbound.  Return for worsening symptoms, including fever, severe abdominal pain, difficulty walking, numbness/weakness in legs, escalating pain or any other symptoms concerning ot you.

## 2017-05-10 ENCOUNTER — Encounter (HOSPITAL_COMMUNITY): Payer: Self-pay | Admitting: Emergency Medicine

## 2017-05-10 ENCOUNTER — Emergency Department (HOSPITAL_COMMUNITY)
Admission: EM | Admit: 2017-05-10 | Discharge: 2017-05-10 | Disposition: A | Discharge status: Home / Self Care | Payer: BLUE CROSS/BLUE SHIELD | Attending: Emergency Medicine | Admitting: Emergency Medicine

## 2017-05-10 DIAGNOSIS — F17200 Nicotine dependence, unspecified, uncomplicated: Secondary | ICD-10-CM | POA: Insufficient documentation

## 2017-05-10 DIAGNOSIS — M545 Low back pain, unspecified: Secondary | ICD-10-CM

## 2017-05-10 DIAGNOSIS — X500XXA Overexertion from strenuous movement or load, initial encounter: Secondary | ICD-10-CM | POA: Insufficient documentation

## 2017-05-10 DIAGNOSIS — Y9263 Factory as the place of occurrence of the external cause: Secondary | ICD-10-CM | POA: Insufficient documentation

## 2017-05-10 DIAGNOSIS — Y990 Civilian activity done for income or pay: Secondary | ICD-10-CM | POA: Insufficient documentation

## 2017-05-10 LAB — URINE CULTURE: Culture: NO GROWTH

## 2017-05-10 MED ORDER — CYCLOBENZAPRINE HCL 10 MG PO TABS: 10.0000 mg | ORAL_TABLET | Freq: Two times a day (BID) | ORAL | 0 refills | Status: DC | PRN

## 2017-05-10 NOTE — ED Provider Notes (Signed)
[facility]-EMERGENCY DEPT Provider Note   CSN: [id number] Arrival date & time: 05/10/17  1419     History   Chief Complaint Chief Complaint  Patient presents with  . Back Pain    HPI Gary Ray is a 31 y.o. male.  HPI   Gary Ray is a 31yo male with no significant past medical history who presents to the Emergency Department for evaluation of lower back pain. Patient was seen in the ER yesterday for his pain, diagnosed with musculoskeletal back pain and states that his pain has not improved which is why he is here for a "second opinion." He states that he has had lower back pain for the past four days now. Pain is an 8/10 in severity and described as "sore." It is constant, worsened with ambulation or bending, twisting in certain positions. He denies fevers, numbness, weakness, perianal numbness, urinary incontinence or retention, dysuria, testicular/penile pain, abdominal pain, N/V. He denies IVDU, no history of cancer. He states that he got ibuprofen yesterday, but that only "helped temporarily."  He works in an [employer] and does some lifting throughout the week. He states that his pain has not changed or worsened since yesterday but is concerned that it has not gone away. Denies inciting injury.   Per record review, he got CBC, CMP, UA, Lipase done yesterday. UA was sent for culture, but results show no bacterial growth. CBC was WNL and CMP was largely normal. Lipase negative.   History reviewed. No pertinent past medical history.  There are no active problems to display for this patient.   History reviewed. No pertinent surgical history.     Home Medications    Prior to Admission medications   Medication Sig Start Date End Date Taking? Authorizing Provider  cyclobenzaprine (FLEXERIL) 10 MG tablet Take 1 tablet (10 mg total) by mouth 2 (two) times daily as needed for muscle spasms. 05/10/17   Kellie Shropshire, PA-C  ibuprofen (ADVIL,MOTRIN) 600 MG tablet Take 1 tablet  (600 mg total) by mouth every 6 (six) hours as needed. 10/28/16   Elpidio Anis, PA-C  oxyCODONE-acetaminophen (PERCOCET/ROXICET) 5-325 MG tablet Take 1 tablet by mouth every 4 (four) hours as needed for severe pain. 10/28/16   Elpidio Anis, PA-C    Family History No family history on file.  Social History Social History  Substance Use Topics  . Smoking status: Current Every Day Smoker  . Smokeless tobacco: Never Used  . Alcohol use Yes     Allergies   Patient has no known allergies.   Review of Systems Review of Systems  Constitutional: Negative for chills, fatigue and fever.  Respiratory: Negative for shortness of breath.   Cardiovascular: Negative for chest pain.  Gastrointestinal: Negative for abdominal pain, blood in stool, diarrhea, nausea and vomiting.  Genitourinary: Negative for difficulty urinating, dysuria, flank pain, frequency, hematuria and testicular pain.  Musculoskeletal: Positive for back pain. Negative for gait problem.  Skin: Negative for wound.  Neurological: Negative for dizziness, weakness, numbness and headaches.     Physical Exam Updated Vital Signs BP 123/84 (BP Location: Left Arm)   Pulse 79   Temp 98.5 F (36.9 C) (Oral)   Resp 16   SpO2 98%   Physical Exam  Constitutional: He appears well-developed and well-nourished. No distress.  Non-toxic appearing, sitting calmly in the room answering questions appropriately.   HENT:  Head: Normocephalic and atraumatic.  Eyes: Right eye exhibits no discharge. Left eye exhibits no discharge.  Neck: Normal range  of motion. Neck supple.  Cardiovascular: Normal rate, regular rhythm and intact distal pulses.  Exam reveals no friction rub.   No murmur heard. Pulmonary/Chest: Effort normal and breath sounds normal. No respiratory distress. He has no wheezes. He has no rales.  Abdominal: Soft. Bowel sounds are normal. There is no tenderness. There is no rebound and no guarding.  No pulsatile mass. No CVA  tenderness.   Musculoskeletal:  Tenderness to palpation over lumbar spinous processes and paraspinal muscles. No tenderness over thoracic spinous processes.    Neurological: He is alert. Coordination normal.  Mental Status:  Alert, oriented, thought content appropriate, able to give a coherent history. Speech fluent without evidence of aphasia. Able to follow 2 step commands without difficulty.  Motor:  Normal tone. 5/5 in upper and lower extremities bilaterally including strong and equal grip strength and dorsiflexion/plantar flexion Sensory: Pinprick and light touch normal in all extremities.  Deep Tendon Reflexes: 2+ and symmetric in the biceps and patella Gait: normal gait and balance  Skin: Skin is warm and dry. Capillary refill takes less than 2 seconds. He is not diaphoretic.  Psychiatric: He has a normal mood and affect. His behavior is normal.  Nursing note and vitals reviewed.    ED Treatments / Results  Labs (all labs ordered are listed, but only abnormal results are displayed) Labs Reviewed - No data to display  EKG  EKG Interpretation None       Radiology No results found.  Procedures Procedures (including critical care time)  Medications Ordered in ED Medications - No data to display   Initial Impression / Assessment and Plan / ED Course  I have reviewed the triage vital signs and the nursing notes.  Pertinent labs & imaging results that were available during my care of the patient were reviewed by me and considered in my medical decision making (see chart for details).    Patient presents with back pain that is unchanged from yesterday when he was seen in the ED. Of note, he has not used any medication or heat for his pain. Reviewed labs from yesterday CBC WNL, CMP largely normal, UA culture negative, Lipase negative. No neurological deficits and normal neuro exam. Patient can walk but states is painful. No loss of bowel or bladder control. No concern for  cauda equina.  No fever, night sweats, weight loss, h/o cancer, IVDU. RICE protocol and pain medicine indicated and discussed with patient. Discussed strict return precautions and patient agrees and voices understanding. Discussed this patient with Dr. Particia Nearing who agrees with discharge.   Final Clinical Impressions(s) / ED Diagnoses   Final diagnoses:  Acute bilateral low back pain without sciatica    New Prescriptions New Prescriptions   CYCLOBENZAPRINE (FLEXERIL) 10 MG TABLET    Take 1 tablet (10 mg total) by mouth 2 (two) times daily as needed for muscle spasms.     Kellie Shropshire, PA-C 05/10/17 1724    Jacalyn Lefevre, MD 05/10/17 1818

## 2017-05-10 NOTE — ED Triage Notes (Signed)
Pt complaint of lower back pain since Friday; denies injury or GU symptoms.

## 2017-05-10 NOTE — Discharge Instructions (Signed)
Your urine test did not show evidence of infection. Your blood work from yesterday was normal.   Your exam is consistent with musculoskeletal lower back pain. Please take ibuprofen  every six hours for pain. Also take Flexeril which is a muscle relaxer for pain. This medicine can make you drowsy. Please do not drive or drink alcohol while taking this medication.   If you develop severe or worsening pain, low back pain with fever, numbness, weakness or inability to walk or urinate, you should return to the ER immediately.  Please follow up with your doctor this week for a recheck if still having symptoms.  Low back pain is discomfort in the lower back that may be due to injuries to muscles and ligaments around the spine.  Occasionally, it may be caused by a a problem to a part of the spine called a disc.  The pain may last several days or a week;  However, most patients get completely well in 4 weeks.  Self - care:  The application of heat can help soothe the pain.  Maintaining your daily activities, including walking, is encourged, as it will help you get better faster than just staying in bed.

## 2017-09-10 ENCOUNTER — Emergency Department (HOSPITAL_COMMUNITY): Payer: BLUE CROSS/BLUE SHIELD

## 2017-09-10 ENCOUNTER — Encounter (HOSPITAL_COMMUNITY): Payer: Self-pay | Admitting: Emergency Medicine

## 2017-09-10 ENCOUNTER — Emergency Department (HOSPITAL_COMMUNITY)
Admission: EM | Admit: 2017-09-10 | Discharge: 2017-09-10 | Disposition: A | Discharge status: Home / Self Care | Payer: BLUE CROSS/BLUE SHIELD | Attending: Emergency Medicine | Admitting: Emergency Medicine

## 2017-09-10 ENCOUNTER — Other Ambulatory Visit: Payer: Self-pay

## 2017-09-10 DIAGNOSIS — F17210 Nicotine dependence, cigarettes, uncomplicated: Secondary | ICD-10-CM | POA: Insufficient documentation

## 2017-09-10 DIAGNOSIS — M25562 Pain in left knee: Secondary | ICD-10-CM | POA: Insufficient documentation

## 2017-09-10 DIAGNOSIS — M7989 Other specified soft tissue disorders: Secondary | ICD-10-CM | POA: Insufficient documentation

## 2017-09-10 MED ORDER — NAPROXEN 250 MG PO TABS
500.0000 mg | ORAL_TABLET | Freq: Once | ORAL | Status: AC
  Administered 2017-09-10: 500 mg via ORAL
  Filled 2017-09-10: qty 2

## 2017-09-10 MED ORDER — NAPROXEN 500 MG PO TABS: 500.0000 mg | ORAL_TABLET | Freq: Two times a day (BID) | ORAL | 0 refills | Status: DC

## 2017-09-10 NOTE — ED Provider Notes (Signed)
[facility]   CSN: [id number] Arrival date & time: 09/10/17  1619     History   Chief Complaint Chief Complaint  Patient presents with  . Knee Pain    left    HPI Gary Ray is a 32 y.o. male with a hx of tobacco abuse who presents to the ED complaining of L knee pain x 1 month. States pain is constant, progressively worsening. States pain is a 7/10 in severity that worsens with ambulating as well as flexion/extension and improves with rest. Has not tried any at home interventions or medications. Patient states hx of intermittent problems with bilateral knees for several years that has improved with prescribed pain medication, no hx of gout or septic joint. Patient states no injury or change in activity. Denies numbness, weakness, fever, or chills.   HPI  History reviewed. No pertinent past medical history.  There are no active problems to display for this patient.   History reviewed. No pertinent surgical history.     Home Medications    Prior to Admission medications   Medication Sig Start Date End Date Taking? Authorizing Provider  cyclobenzaprine (FLEXERIL) 10 MG tablet Take 1 tablet (10 mg total) by mouth 2 (two) times daily as needed for muscle spasms. 05/10/17   Kellie ShropshireShrosbree, Emily J, PA-C  ibuprofen (ADVIL,MOTRIN) 600 MG tablet Take 1 tablet (600 mg total) by mouth every 6 (six) hours as needed. 10/28/16   Elpidio AnisUpstill, Shari, PA-C  oxyCODONE-acetaminophen (PERCOCET/ROXICET) 5-325 MG tablet Take 1 tablet by mouth every 4 (four) hours as needed for severe pain. 10/28/16   Elpidio AnisUpstill, Shari, PA-C    Family History History reviewed. No pertinent family history.  Social History Social History   Tobacco Use  . Smoking status: Current Every Day Smoker  . Smokeless tobacco: Never Used  Substance Use Topics  . Alcohol use: Yes    Comment: SOCIAL  . Drug use: Yes    Types: Marijuana     Allergies   Patient has no  known allergies.   Review of Systems Review of Systems  Constitutional: Negative for chills and fever.  Musculoskeletal: Positive for arthralgias (L knee) and joint swelling (L knee).  Skin: Negative for color change.  Neurological: Negative for weakness and numbness.     Physical Exam Updated Vital Signs BP (!) 145/105   Pulse 71   Temp 98 F (36.7 C) (Oral)   Resp 16   Ht 5\' 6"  (1.676 m)   Wt 68 kg (150 lb)   SpO2 100%   BMI 24.21 kg/m   Physical Exam  Constitutional: He appears well-developed and well-nourished. No distress.  HENT:  Head: Normocephalic and atraumatic.  Eyes: Conjunctivae are normal. Right eye exhibits no discharge. Left eye exhibits no discharge.  Cardiovascular:  Pulses:      Dorsalis pedis pulses are 2+ on the right side, and 2+ on the left side.  Musculoskeletal:  Lower extremities: There is no obvious deformity, appreciable swelling, erythema, ecchymosis, or overlying warmth.  Patient has full range of motion to bilateral hips and ankles.  Full range of motion of the right knee.  Patient has full extension of the left knee, flexion is minimally limited secondary to pain, however patient is able to flex past 90 degrees.  There is some tenderness to palpation to the patella and just inferior to this.  She is otherwise nontender.  There is no joint line tenderness or popliteal fossa tenderness.  Negative valgus  and varus stress tests.   Neurological: He is alert.  Clear speech.  Grossly intact bilateral lower extremities.  Patient has 5 out of 5 strength with knee flexion and extension bilaterally as well as ankle plantar and dorsiflexion bilaterally.  Gait intact, but antalgic.  Psychiatric: He has a normal mood and affect. His behavior is normal. Thought content normal.  Nursing note and vitals reviewed.   ED Treatments / Results  Labs (all labs ordered are listed, but only abnormal results are displayed) Labs Reviewed - No data to display  EKG   EKG Interpretation None       Radiology Dg Knee Complete 4 Views Left  Result Date: 09/10/2017 CLINICAL DATA:  Left knee pain for 1 month.  No known injury. EXAM: LEFT KNEE - COMPLETE 4+ VIEW COMPARISON:  None. FINDINGS: No evidence of fracture, dislocation, or joint effusion. No evidence of arthropathy or other focal bone abnormality. Soft tissues are unremarkable. IMPRESSION: Negative. Electronically Signed   By: Myles Rosenthal M.D.   On: 09/10/2017 16:53    Procedures Procedures (including critical care time)  Medications Ordered in ED Medications  naproxen (NAPROSYN) tablet 500 mg (500 mg Oral Given 09/10/17 1916)     Initial Impression / Assessment and Plan / ED Course  I have reviewed the triage vital signs and the nursing notes.  Pertinent labs & imaging results that were available during my care of the patient were reviewed by me and considered in my medical decision making (see chart for details).    Patient presents with left knee pain/swelling.  Patient is nontoxic appearing, vitals are WNL other than somewhat elevated BP- no indication of HTN emergency discussed need for recheck by PCP with patient. Patient with with mildly restricted range of motion secondary to pain- unable to perform full flexion, is however able to flex past 90 degrees.  Patient is without systemic symptoms, fevers, erythema, or redness of the joint consistent with gout or septic joint.  Patient X-Ray negative for obvious fracture or dislocation. There is no effusion or appreciable swelling. Patient is NVI distally. Will provide knee sleeve and prescribe naproxen (last creatinine WNL) with orthopedics follow up. I discussed results, treatment plan, need for PCP and ortho follow-up, and return precautions with the patient. Provided opportunity for questions, patient confirmed understanding and is in agreement with plan.   Final Clinical Impressions(s) / ED Diagnoses   Final diagnoses:  Left knee pain,  unspecified chronicity    ED Discharge Orders        Ordered    naproxen (NAPROSYN) 500 MG tablet  2 times daily     09/10/17 1902       Cherly Anderson, [place] 09/10/17 1954    Margarita Grizzle, MD 09/11/17 [phone]

## 2017-09-10 NOTE — ED Triage Notes (Signed)
Pt presents with L knee pain x 1 month; pt denies any known injury; no swelling, redness noted; pt reports crepitus with moveemnt

## 2017-09-10 NOTE — Discharge Instructions (Signed)
You were seen in the emergency department for pain and swelling in your left knee.  Your x-ray did not show any fracture or dislocation.  I have prescribed you naproxen this is a nonsteroidal anti-inflammatory medication.  You may take this once as needed every 12 hours for pain and swelling, be sure to take this with food as it can cause stomach upset and at worst stomach bleeding.  Do not take other nonsteroidal anti-inflammatories while taking this medicine including Goody powder, Advil, Aleve, or Motrin.  You may supplement with Tylenol.  Apply ice to the knee 20 minutes on 40 minutes off for the next 24-48 hours to be sure to use a towel to wrap the ice in.   Wear the knee brace provided to help with swelling. Be sure to remove this if it is too tight- such as if you start to have tingling or numbness to your feet or they appear pale or blue.   Follow-up with the orthopedic doctor that I have provided in your discharge instructions within 1 week if you have not had improvement in your symptoms.  Return to the emergency department for any new or worsening symptoms including but not limited to fever, chills, numbness, weakness, inability to walk, redness of the knee, increase the pain or swelling.  Your vital signs today were: BP (!) 145/105    Pulse 71    Temp 98 F (36.7 C) (Oral)    Resp 16    Ht 5\' 6"  (1.676 m)    Wt 68 kg (150 lb)    SpO2 100%    BMI 24.21 kg/m  If your blood pressure (BP) was elevated above 135/85 this visit, please have this repeated by your doctor within one week.  ---------------

## 2017-09-27 ENCOUNTER — Other Ambulatory Visit: Payer: Self-pay

## 2017-09-27 ENCOUNTER — Emergency Department (HOSPITAL_COMMUNITY): Payer: Self-pay

## 2017-09-27 ENCOUNTER — Encounter (HOSPITAL_COMMUNITY): Payer: Self-pay

## 2017-09-27 ENCOUNTER — Emergency Department (HOSPITAL_COMMUNITY)
Admission: EM | Admit: 2017-09-27 | Discharge: 2017-09-27 | Disposition: A | Discharge status: Home / Self Care | Payer: Self-pay | Attending: Emergency Medicine | Admitting: Emergency Medicine

## 2017-09-27 DIAGNOSIS — M25562 Pain in left knee: Secondary | ICD-10-CM | POA: Insufficient documentation

## 2017-09-27 DIAGNOSIS — F17210 Nicotine dependence, cigarettes, uncomplicated: Secondary | ICD-10-CM | POA: Insufficient documentation

## 2017-09-27 LAB — BASIC METABOLIC PANEL
ANION GAP: 9 (ref 5–15)
BUN: 8 mg/dL (ref 6–20)
CALCIUM: 9.5 mg/dL (ref 8.9–10.3)
CO2: 25 mmol/L (ref 22–32)
CREATININE: 1.14 mg/dL (ref 0.61–1.24)
Chloride: 104 mmol/L (ref 101–111)
GFR calc Af Amer: >60 mL/min (ref >60)
GLUCOSE: 97 mg/dL (ref 65–99)
Potassium: 4.6 mmol/L (ref 3.5–5.1)
Sodium: 138 mmol/L (ref 135–145)

## 2017-09-27 LAB — CBC WITH DIFFERENTIAL/PLATELET
BASOS ABS: 0 10*3/uL (ref 0.0–0.1)
Basophils Relative: 0 %
EOS PCT: 3 %
Eosinophils Absolute: 0.2 10*3/uL (ref 0.0–0.7)
HCT: 46.1 % (ref 39.0–52.0)
Hemoglobin: 16 g/dL (ref 13.0–17.0)
Lymphocytes Relative: 29 %
Lymphs Abs: 2.2 10*3/uL (ref 0.7–4.0)
MCH: 33.6 pg (ref 26.0–34.0)
MCHC: 34.7 g/dL (ref 30.0–36.0)
MCV: 96.8 fL (ref 78.0–100.0)
MONO ABS: 0.5 10*3/uL (ref 0.1–1.0)
Monocytes Relative: 6 %
Neutro Abs: 4.7 10*3/uL (ref 1.7–7.7)
Neutrophils Relative %: 62 %
PLATELETS: 220 10*3/uL (ref 150–400)
RBC: 4.76 MIL/uL (ref 4.22–5.81)
RDW: 12.6 % (ref 11.5–15.5)
WBC: 7.6 10*3/uL (ref 4.0–10.5)

## 2017-09-27 LAB — SYNOVIAL CELL COUNT + DIFF, W/ CRYSTALS
CRYSTALS FLUID: NONE SEEN
LYMPHOCYTES-SYNOVIAL FLD: 2 % (ref 0–20)
MONOCYTE-MACROPHAGE-SYNOVIAL FLUID: 9 % — AB (ref 50–90)
Neutrophil, Synovial: 89 % — ABNORMAL HIGH (ref 0–25)
WBC, SYNOVIAL: 19500 /mm3 — AB (ref 0–200)

## 2017-09-27 MED ORDER — HYDROCODONE-ACETAMINOPHEN 7.5-325 MG/15ML PO SOLN: 15.0000 mL | Freq: Three times a day (TID) | ORAL | 0 refills | Status: DC | PRN

## 2017-09-27 MED ORDER — LIDOCAINE HCL (PF) 1 % IJ SOLN
5.0000 mL | Freq: Once | INTRAMUSCULAR | Status: AC
  Administered 2017-09-27: 5 mL via INTRADERMAL
  Filled 2017-09-27: qty 5

## 2017-09-27 NOTE — ED Notes (Addendum)
Patient reports that his knee pain and swelling has been going on for a month and half. Patient withdraws from pain upon assessment. He reports that he cannot bend it. Knee appears swollen

## 2017-09-27 NOTE — Progress Notes (Signed)
Orthopedic Tech Progress Note Patient Details:  Janell QuietJoseph Terada 08-28-1985 [id number]  Ortho Devices Type of Ortho Device: Crutches Ortho Device/Splint Interventions: Application   Post Interventions Patient Tolerated: Well, Ambulated well Instructions Provided: Care of device, Adjustment of device, Poper ambulation with device   Saul FordyceJennifer C Charizma Gardiner 09/27/2017, 4:12 PM

## 2017-09-27 NOTE — ED Triage Notes (Signed)
Pt states he has been left knee pain X2 weeks. He was seen here for same. XR preformed, followed up with orthopedic but states pain has persisted. Swelling noted. Warm to touch.

## 2017-09-27 NOTE — ED Notes (Signed)
Pt d/c with crutches. Offered wheelchair but he declined. Verbalized understanding of discharge instructions and escorted to the lobby.

## 2017-09-27 NOTE — ED Notes (Signed)
Paged ortho tech 

## 2017-09-27 NOTE — Discharge Instructions (Addendum)
Take vicodin for breakthrough pain, do not drink alcohol, drive, care for children or do other critical tasks while taking vicodin.  Please make an appointment with your orthopedist, if you have any new or worsening symptoms return to the emergency department for recheck.

## 2017-09-27 NOTE — ED Notes (Signed)
Patient transported to X-ray 

## 2017-09-27 NOTE — ED Provider Notes (Signed)
[facility] EMERGENCY DEPARTMENT Provider Note   CSN: [id number] Arrival date & time: 09/27/17  1025     History   Chief Complaint Chief Complaint  Patient presents with  . Knee Pain    HPI   Blood pressure 126/83, pulse 79, temperature 98.4 F (36.9 C), temperature source Oral, resp. rate 16, SpO2 97 %.  Gary Ray is a 32 y.o. male complaining of severe left knee pain, atraumatic and worsening over the course of the last month.  He feels that the area is swollen, he can no longer bend the knee, even in passive motion.  He denies any fevers, chills.  Patient is not diabetic, multiple over-the-counter pain medications taken with little relief.  History reviewed. No pertinent past medical history.  There are no active problems to display for this patient.   History reviewed. No pertinent surgical history.     Home Medications    Prior to Admission medications   Medication Sig Start Date End Date Taking? Authorizing Provider  cyclobenzaprine (FLEXERIL) 10 MG tablet Take 1 tablet (10 mg total) by mouth 2 (two) times daily as needed for muscle spasms. 05/10/17   Kellie Shropshire, PA-C  HYDROcodone-acetaminophen (HYCET) 7.5-325 mg/15 ml solution Take 15 mLs by mouth every 8 (eight) hours as needed for moderate pain. 09/27/17   Cristella Stiver, Joni Reining, PA-C  ibuprofen (ADVIL,MOTRIN) 600 MG tablet Take 1 tablet (600 mg total) by mouth every 6 (six) hours as needed. 10/28/16   Elpidio Anis, PA-C  naproxen (NAPROSYN) 500 MG tablet Take 1 tablet (500 mg total) by mouth 2 (two) times daily. 09/10/17   Petrucelli, Pleas Koch, PA-C  oxyCODONE-acetaminophen (PERCOCET/ROXICET) 5-325 MG tablet Take 1 tablet by mouth every 4 (four) hours as needed for severe pain. 10/28/16   Elpidio Anis, PA-C    Family History History reviewed. No pertinent family history.  Social History Social History   Tobacco Use  . Smoking status: Current Every Day Smoker  . Smokeless tobacco:  Never Used  Substance Use Topics  . Alcohol use: Yes    Comment: SOCIAL  . Drug use: Yes    Types: Marijuana     Allergies   Patient has no known allergies.   Review of Systems Review of Systems  A complete review of systems was obtained and all systems are negative except as noted in the HPI and PMH.   Physical Exam Updated Vital Signs BP 126/83 (BP Location: Right Arm)   Pulse 79   Temp 98.4 F (36.9 C) (Oral)   Resp 16   SpO2 97%   Physical Exam  Constitutional: He is oriented to person, place, and time. He appears well-developed and well-nourished. No distress.  HENT:  Head: Normocephalic and atraumatic.  Mouth/Throat: Oropharynx is clear and moist.  Eyes: Conjunctivae and EOM are normal. Pupils are equal, round, and reactive to light.  Neck: Normal range of motion.  Cardiovascular: Normal rate, regular rhythm and intact distal pulses.  Pulmonary/Chest: Effort normal and breath sounds normal.  Abdominal: Soft. There is no tenderness.  Musculoskeletal:  Left knee with mild effusion, mild warmth, patient refuses to flex the knee at all, distally neurovascular intact, no overlying skin changes, trauma or induration.  Neurological: He is alert and oriented to person, place, and time.  Skin: He is not diaphoretic.  Psychiatric: He has a normal mood and affect.  Nursing note and vitals reviewed.    ED Treatments / Results  Labs (all labs ordered are listed, but  only abnormal results are displayed) Labs Reviewed  GRAM STAIN  BODY FLUID CULTURE  ANAEROBIC CULTURE  CBC WITH DIFFERENTIAL/PLATELET  BASIC METABOLIC PANEL  SYNOVIAL CELL COUNT + DIFF, W/ CRYSTALS    EKG  EKG Interpretation None       Radiology Dg Knee Complete 4 Views Left  Result Date: 09/27/2017 CLINICAL DATA:  Lies left knee fluid with effusion. The patient is unable to bend or plaque pressure to the knee. No previous injury. EXAM: LEFT KNEE - COMPLETE 4+ VIEW COMPARISON:  Left knee series  of September 10, 2017 FINDINGS: The bones are subjectively adequately mineralized. There is no acute or healing fracture. There is no chondrocalcinosis. The joint spaces are well maintained. No spurring is noted. No suprapatellar effusion is demonstrated. IMPRESSION: No acute bony abnormality of the left knee. Electronically Signed   By: David  [place] M.D.   On: 09/27/2017 14:07    Procedures .Joint Aspiration/Arthrocentesis Date/Time: 09/27/2017 3:34 PM Performed by: Wynetta Emery, PA-C Authorized by: Wynetta Emery, PA-C   Consent:    Consent obtained:  Verbal   Consent given by:  Patient   Risks discussed:  Infection   Alternatives discussed:  No treatment, delayed treatment, alternative treatment and referral Location:    Location:  Knee Anesthesia (see MAR for exact dosages):    Anesthesia method:  Topical application and local infiltration   Local anesthetic:  Bupivacaine 0.5% w/o epi Procedure details:    Preparation: Patient was prepped and draped in usual sterile fashion     Needle gauge:  18 G   Ultrasound guidance: no     Approach:  Lateral   Aspirate amount:  15   Aspirate characteristics:  Blood-tinged and yellow   Steroid injected: no   Post-procedure details:    Dressing:  Adhesive bandage   Patient tolerance of procedure:  Tolerated well, no immediate complications   (including critical care time)  Medications Ordered in ED Medications  lidocaine (PF) (XYLOCAINE) 1 % injection 5 mL (not administered)     Initial Impression / Assessment and Plan / ED Course  I have reviewed the triage vital signs and the nursing notes.  Pertinent labs & imaging results that were available during my care of the patient were reviewed by me and considered in my medical decision making (see chart for details).     Vitals:   09/27/17 1031 09/27/17 1242  BP: (!) 139/106 126/83  Pulse: 88 79  Resp: 20 16  Temp: 98.3 F (36.8 C) 98.4 F (36.9 C)  TempSrc: Oral Oral    SpO2: 98% 97%    Medications  lidocaine (PF) (XYLOCAINE) 1 % injection 5 mL (not administered)    Gary Ray is 32 y.o. male presenting with atraumatic left knee pain worsening over the course of the last month, patient cannot flex the knee at all.  Trace warmth and effusion.  Possible septic joint given his severe pain and reduced range of motion.  Arthrocentesis performed with 15 cc of hazy yellow fluid obtained.  Pending analysis Case signed out to PA Lawyer at shift change.      Final Clinical Impressions(s) / ED Diagnoses   Final diagnoses:  Left knee pain, unspecified chronicity    ED Discharge Orders        Ordered    HYDROcodone-acetaminophen (HYCET) 7.5-325 mg/15 ml solution  Every 8 hours PRN     09/27/17 1526       Ersie Savino, Mardella Layman 09/27/17 1536    Nanavati,  Ankit, MD 09/29/17 2204

## 2017-10-01 LAB — BODY FLUID CULTURE: Culture: NO GROWTH

## 2017-10-02 LAB — ANAEROBIC CULTURE

## 2019-11-26 ENCOUNTER — Emergency Department (HOSPITAL_COMMUNITY)
Admission: EM | Admit: 2019-11-26 | Discharge: 2019-11-26 | Disposition: A | Discharge status: Home / Self Care | Payer: Self-pay | Attending: Emergency Medicine | Admitting: Emergency Medicine

## 2019-11-26 ENCOUNTER — Other Ambulatory Visit: Payer: Self-pay

## 2019-11-26 ENCOUNTER — Encounter (HOSPITAL_COMMUNITY): Payer: Self-pay | Admitting: Emergency Medicine

## 2019-11-26 ENCOUNTER — Emergency Department (HOSPITAL_COMMUNITY): Payer: Self-pay

## 2019-11-26 DIAGNOSIS — Z79899 Other long term (current) drug therapy: Secondary | ICD-10-CM | POA: Insufficient documentation

## 2019-11-26 DIAGNOSIS — N390 Urinary tract infection, site not specified: Secondary | ICD-10-CM | POA: Insufficient documentation

## 2019-11-26 DIAGNOSIS — N200 Calculus of kidney: Secondary | ICD-10-CM | POA: Insufficient documentation

## 2019-11-26 DIAGNOSIS — F17210 Nicotine dependence, cigarettes, uncomplicated: Secondary | ICD-10-CM | POA: Insufficient documentation

## 2019-11-26 DIAGNOSIS — R109 Unspecified abdominal pain: Secondary | ICD-10-CM

## 2019-11-26 LAB — URINALYSIS, ROUTINE W REFLEX MICROSCOPIC
Bilirubin Urine: NEGATIVE
Glucose, UA: NEGATIVE mg/dL
Ketones, ur: NEGATIVE mg/dL
Nitrite: NEGATIVE
Protein, ur: 30 mg/dL — AB
Specific Gravity, Urine: 1.018 (ref 1.005–1.030)
pH: 5 (ref 5.0–8.0)

## 2019-11-26 LAB — BASIC METABOLIC PANEL
Anion gap: 17 — ABNORMAL HIGH (ref 5–15)
Anion gap: 14 (ref 5–15)
BUN: 14 mg/dL (ref 6–20)
BUN: 12 mg/dL (ref 6–20)
CO2: 19 mmol/L — ABNORMAL LOW (ref 22–32)
CO2: 25 mmol/L (ref 22–32)
Calcium: 9.4 mg/dL (ref 8.9–10.3)
Calcium: 9.8 mg/dL (ref 8.9–10.3)
Chloride: 104 mmol/L (ref 98–111)
Chloride: 102 mmol/L (ref 98–111)
Creatinine, Ser: 1.48 mg/dL — ABNORMAL HIGH (ref 0.61–1.24)
Creatinine, Ser: 1.25 mg/dL — ABNORMAL HIGH (ref 0.61–1.24)
GFR calc Af Amer: >60 mL/min (ref >60)
GFR calc Af Amer: >60 mL/min (ref >60)
GFR calc non Af Amer: >60 mL/min (ref >60)
GFR calc non Af Amer: >60 mL/min (ref >60)
Glucose, Bld: 173 mg/dL — ABNORMAL HIGH (ref 70–99)
Glucose, Bld: 105 mg/dL — ABNORMAL HIGH (ref 70–99)
Potassium: 5.3 mmol/L — ABNORMAL HIGH (ref 3.5–5.1)
Potassium: 4.6 mmol/L (ref 3.5–5.1)
Sodium: 140 mmol/L (ref 135–145)
Sodium: 141 mmol/L (ref 135–145)

## 2019-11-26 LAB — CBC
HCT: 48.3 % (ref 39.0–52.0)
Hemoglobin: 16.2 g/dL (ref 13.0–17.0)
MCH: 32 pg (ref 26.0–34.0)
MCHC: 33.5 g/dL (ref 30.0–36.0)
MCV: 95.5 fL (ref 80.0–100.0)
Platelets: 250 10*3/uL (ref 150–400)
RBC: 5.06 MIL/uL (ref 4.22–5.81)
RDW: 13 % (ref 11.5–15.5)
WBC: 8.8 10*3/uL (ref 4.0–10.5)
nRBC: 0 % (ref 0.0–0.2)

## 2019-11-26 MED ORDER — ONDANSETRON 4 MG PO TBDP
ORAL_TABLET | ORAL | 0 refills | Status: DC
Start: 2019-11-26 — End: 2023-04-18

## 2019-11-26 MED ORDER — CEPHALEXIN 500 MG PO CAPS
500.0000 mg | ORAL_CAPSULE | Freq: Four times a day (QID) | ORAL | 0 refills | Status: DC
Start: 2019-11-26 — End: 2023-04-18

## 2019-11-26 MED ORDER — MORPHINE SULFATE (PF) 4 MG/ML IV SOLN
4.0000 mg | Freq: Once | INTRAVENOUS | Status: AC
  Administered 2019-11-26: 14:00:00 4 mg via INTRAVENOUS
  Filled 2019-11-26: qty 1

## 2019-11-26 MED ORDER — OXYCODONE-ACETAMINOPHEN 5-325 MG PO TABS
1.0000 | ORAL_TABLET | ORAL | Status: DC | PRN
  Administered 2019-11-26: 1 via ORAL
  Filled 2019-11-26: qty 1

## 2019-11-26 MED ORDER — SODIUM CHLORIDE 0.9 % IV SOLN
1.0000 g | Freq: Once | INTRAVENOUS | Status: AC
  Administered 2019-11-26: 14:00:00 1 g via INTRAVENOUS
  Filled 2019-11-26: qty 10

## 2019-11-26 MED ORDER — HYDROCODONE-ACETAMINOPHEN 5-325 MG PO TABS: 1.0000 | ORAL_TABLET | Freq: Four times a day (QID) | ORAL | 0 refills | Status: DC | PRN

## 2019-11-26 MED ORDER — SODIUM CHLORIDE 0.9 % IV BOLUS
1000.0000 mL | Freq: Once | INTRAVENOUS | Status: AC
  Administered 2019-11-26: 14:00:00 1000 mL via INTRAVENOUS

## 2019-11-26 MED ORDER — ONDANSETRON HCL 4 MG/2ML IJ SOLN
4.0000 mg | Freq: Once | INTRAMUSCULAR | Status: AC
  Administered 2019-11-26: 14:00:00 4 mg via INTRAVENOUS
  Filled 2019-11-26: qty 2

## 2019-11-26 MED ORDER — ONDANSETRON 4 MG PO TBDP
4.0000 mg | ORAL_TABLET | Freq: Once | ORAL | Status: AC | PRN
  Administered 2019-11-26: 4 mg via ORAL
  Filled 2019-11-26: qty 1

## 2019-11-26 NOTE — Discharge Instructions (Addendum)
Your CT scan suggests that you have already passed your kidney stone, but your urine also shows some signs of infection.  We will place you on a course of antibiotics and in addition to that you can use the below medications to help treat your pain.  -Ibuprofen 800 mg-this is a medication that will help with pain as well as passing the stone.  Please take this every 8 hours.  Take this with food as it can cause stomach upset and at worst stomach bleeding.  Do not take other NSAIDs such as Motrin, Aleve, Advil, Mobic, or Naproxen with this medicine as they are similar and would propagate any potential side effects.   -Hydrocodone-this is a narcotic/controlled substance medication that has potential addicting qualities.  We recommend that you take 1-2 tablets every 6 hours as needed for severe pain.  Do not drive or operate heavy machinery when taking this medicine as it can be sedating. Do not drink alcohol or take other sedating medications when taking this medicine for safety reasons.  Keep this out of reach of small children.  Please be aware this medicine has Tylenol in it (325 mg/tab) do not exceed the maximum dose of Tylenol in a day per over the counter recommendations should you decide to supplement with Tylenol over the counter.   -Zofran-this is an antinausea medication, you may take this every 8 hours as needed for nausea and vomiting, please allow the tablet to dissolve underneath of your tongue.   We have prescribed you new medication(s) today. Discuss the medications prescribed today with your pharmacist as they can have adverse effects and interactions with your other medicines including over the counter and prescribed medications. Seek medical evaluation if you start to experience new or abnormal symptoms after taking one of these medicines, seek care immediately if you start to experience difficulty breathing, feeling of your throat closing, facial swelling, or rash as these could be  indications of a more serious allergic reaction  Please follow-up with the urology group provided in your discharge instructions within 3 to 5 days.  Return to the ER for new or worsening symptoms including but not limited to worsening pain not controlled by these medicines, inability to keep fluids down, fever, or any other concerns that you may have.

## 2019-11-26 NOTE — ED Provider Notes (Signed)
[facility] EMERGENCY DEPARTMENT Provider Note   CSN: [id number] Arrival date & time: 11/26/19  0551     History Chief Complaint  Patient presents with  . Flank Pain    Gary Ray is a 34 y.o. male.  Gary Ray is a 34 y.o. male with a history of previous kidney stones, presents to the ED for evaluation of sudden onset right flank pain that began this morning associated with nausea.  He reports pain is a constant stabbing that sometimes radiates down to his groin.  He reports a history of kidney stones and this feels similar to previous.  He has not had any fevers or chills.  He denies any dysuria or urinary frequency.  He has noted some blood in his urine today.  Patient reports pain intermittently becomes very severe, he was noted to be diaphoretic in triage, but then he states he felt the stone moved some, was able to urinate and his pain seemed to ease off.  He reports pain has intermittently come back but not as severe.  He has been referred to urology previously but has not followed up, has not had a kidney stone in a few years.        History reviewed. No pertinent past medical history.  There are no problems to display for this patient.   History reviewed. No pertinent surgical history.     No family history on file.  Social History   Tobacco Use  . Smoking status: Current Every Day Smoker  . Smokeless tobacco: Never Used  Substance Use Topics  . Alcohol use: Yes    Comment: SOCIAL  . Drug use: Yes    Types: Marijuana    Home Medications Prior to Admission medications   Medication Sig Start Date End Date Taking? Authorizing Provider  acetaminophen (TYLENOL) 500 MG tablet Take 1,000 mg by mouth every 6 (six) hours as needed for mild pain or headache.   Yes [provider]  ibuprofen (ADVIL) 200 MG tablet Take 800 mg by mouth every 6 (six) hours as needed for headache or mild pain.   Yes [provider]  cephALEXin  (KEFLEX) 500 MG capsule Take 1 capsule (500 mg total) by mouth 4 (four) times daily. 11/26/19   Dartha Lodge, PA-C  HYDROcodone-acetaminophen (NORCO/VICODIN) 5-325 MG tablet Take 1-2 tablets by mouth every 6 (six) hours as needed. 11/26/19   Dartha Lodge, PA-C  ondansetron (ZOFRAN ODT) 4 MG disintegrating tablet 4mg  ODT q4 hours prn nausea/vomit 11/26/19   11/28/19, PA-C    Allergies    Patient has no known allergies.  Review of Systems   Review of Systems  Constitutional: Negative for chills and fever.  HENT: Negative.   Respiratory: Negative for cough and shortness of breath.   Cardiovascular: Negative for chest pain.  Gastrointestinal: Positive for abdominal pain and nausea. Negative for diarrhea and vomiting.  Genitourinary: Positive for flank pain. Negative for discharge, dysuria, frequency, penile swelling, scrotal swelling and testicular pain.  Musculoskeletal: Positive for back pain. Negative for arthralgias.  Skin: Negative for color change and rash.  All other systems reviewed and are negative.   Physical Exam Updated Vital Signs BP (!) 150/108 (BP Location: Left Arm)   Pulse 68   Temp 97.9 F (36.6 C)   Resp 16   SpO2 98%   Physical Exam Vitals and nursing note reviewed.  Constitutional:      General: He is not in acute distress.  Appearance: Normal appearance. He is well-developed and normal weight. He is not ill-appearing or diaphoretic.  HENT:     Head: Normocephalic and atraumatic.  Eyes:     General:        Right eye: No discharge.        Left eye: No discharge.     Pupils: Pupils are equal, round, and reactive to light.  Cardiovascular:     Rate and Rhythm: Normal rate and regular rhythm.     Heart sounds: Normal heart sounds.  Pulmonary:     Effort: Pulmonary effort is normal. No respiratory distress.     Breath sounds: Normal breath sounds. No wheezing or rales.     Comments: Respirations equal and unlabored, patient able to speak in full  sentences, lungs clear to auscultation bilaterally Abdominal:     General: Bowel sounds are normal. There is no distension.     Palpations: Abdomen is soft. There is no mass.     Tenderness: There is abdominal tenderness. There is right CVA tenderness. There is no guarding.     Comments: Abdomen is soft, nondistended, bowel sounds present throughout, there is some mild tenderness over the right side of the abdomen and there is right-sided CVA tenderness, no guarding or peritoneal signs.  No left-sided abdominal pain.  Musculoskeletal:        General: No deformity.     Cervical back: Neck supple.  Skin:    General: Skin is warm and dry.     Capillary Refill: Capillary refill takes less than 2 seconds.  Neurological:     Mental Status: He is alert.     Coordination: Coordination normal.     Comments: Speech is clear, able to follow commands Moves extremities without ataxia, coordination intact  Psychiatric:        Mood and Affect: Mood normal.        Behavior: Behavior normal.     ED Results / Procedures / Treatments   Labs (all labs ordered are listed, but only abnormal results are displayed) Labs Reviewed  URINALYSIS, ROUTINE W REFLEX MICROSCOPIC - Abnormal; Notable for the following components:      Result Value   Hgb urine dipstick SMALL (*)    Protein, ur 30 (*)    Leukocytes,Ua MODERATE (*)    Bacteria, UA RARE (*)    All other components within normal limits  BASIC METABOLIC PANEL - Abnormal; Notable for the following components:   Potassium 5.3 (*)    CO2 19 (*)    Glucose, Bld 173 (*)    Creatinine, Ser 1.48 (*)    Anion gap 17 (*)    All other components within normal limits  BASIC METABOLIC PANEL - Abnormal; Notable for the following components:   Glucose, Bld 105 (*)    Creatinine, Ser 1.25 (*)    All other components within normal limits  URINE CULTURE  CBC    EKG None  Radiology CT Renal Stone Study  Result Date: 11/26/2019 CLINICAL DATA:  Right  flank pain EXAM: CT ABDOMEN AND PELVIS WITHOUT CONTRAST TECHNIQUE: Multidetector CT imaging of the abdomen and pelvis was performed following the standard protocol without oral or IV contrast. COMPARISON:  October 29, 2014 FINDINGS: Lower chest: There is bibasilar atelectasis. Hepatobiliary: There is hepatic steatosis. No focal liver lesions are evident on this noncontrast enhanced study. Gallbladder wall does not appear appreciably thickened. There is no biliary duct dilatation. Pancreas: There is no pancreatic mass or inflammatory focus. Spleen:  No splenic lesions are evident. Adrenals/Urinary Tract: Adrenals bilaterally appear normal. No evident renal mass on either side. There is slight hydronephrosis on the right. There is no hydronephrosis on the left. There is no intrarenal calculus on either side. There is no ureteral calculus on either side. Note, however, that there is edema tracking along portions of the right ureter. Urinary bladder is midline with wall thickness within normal limits. Stomach/Bowel: There is no appreciable bowel wall or mesenteric thickening. No evident bowel obstruction. The terminal ileum appears normal. There is no demonstrable free air or portal venous air. Vascular/Lymphatic: There is no abdominal aortic aneurysm. Minimal calcification is noted in the abdominal aorta. No adenopathy is appreciable in the abdomen or pelvis. Reproductive: Prostate and seminal vesicles appear normal in size and contour. No evident pelvic mass. Other: Appendix appears normal. No abscess or ascites is evident in the abdomen or pelvis. Musculoskeletal: No blastic or lytic bone lesions. No intramuscular or abdominal wall lesions are evident. IMPRESSION: 1. Slight hydronephrosis on the right and right ureteral edema. No renal or ureteral calculus noted on the right. Suspect recent calculus passage on the right. It should be noted that pyelonephritis could present in this manner. No perinephric fluid or abscess  noted in the right kidney region. 2. Hepatic steatosis. No focal liver lesions evident on this noncontrast enhanced study. 3. No bowel obstruction. No abscess in the abdomen or pelvis. Appendix appears normal. Electronically Signed   By: Bretta Bang III M.D.   On: 11/26/2019 13:22    Procedures Procedures (including critical care time)  Medications Ordered in ED Medications  oxyCODONE-acetaminophen (PERCOCET/ROXICET) 5-325 MG per tablet 1 tablet (1 tablet Oral Given 11/26/19 0558)  ondansetron (ZOFRAN-ODT) disintegrating tablet 4 mg (4 mg Oral Given 11/26/19 0558)  sodium chloride 0.9 % bolus 1,000 mL (0 mLs Intravenous Stopped 11/26/19 1508)  ondansetron (ZOFRAN) injection 4 mg (4 mg Intravenous Given 11/26/19 1408)  morphine 4 MG/ML injection 4 mg (4 mg Intravenous Given 11/26/19 1408)  cefTRIAXone (ROCEPHIN) 1 g in sodium chloride 0.9 % 100 mL IVPB (0 g Intravenous Stopped 11/26/19 1447)    ED Course  I have reviewed the triage vital signs and the nursing notes.  Pertinent labs & imaging results that were available during my care of the patient were reviewed by me and considered in my medical decision making (see chart for details).    MDM Rules/Calculators/A&P                      Patient presents to the ED with complaints of flank/abdominal pain. Patient nontoxic appearing, vitals without significant abnormalities. On exam patient is tender over the right flank and abdomen, no peritoneal signs. DDX: nephrolithiasis, pyelonephritis/UTI, cholecystitis, pancreatitis, bowel obstruction/perforation, appendicitis, dissection, feel nephrolithiasis is most likely at this time will evaluate with labs and CT renal study, analgesics, anti-emetics, and fluids ordered.   I have independently ordered, reviewed and interpreted all labs and imaging:  No leukocytosis, anemia.  Initial BMP obtained from triage with some hemolysis noted, with elevated K and elevated creatinine, will recheck.  CT  renal study with Slight hydronephrosis on the right and right ureteral edema. No renal or ureteral calculus noted on the right. Suspect recent calculus passage on the right. It should be noted that pyelonephritis could present in this manner. No perinephric fluid or abscess noted in the right kidney region. Renal function improved on repeat BMP and hyperkalemia noted with hemolysis has resolved.  Urinalysis does  have moderate leukocytes with WBCs RBCs and rare bacteria concerning for potential superimposed infection and with CT findings that could also represent pyelonephritis will start patient on antibiotics, urine culture pending.  Patient tolerating PO in the ER with pain well controlled. Will discharge home with antibiotics, Zofran, Norco and NSAIDs with urology follow up. [place] Controlled Substance reporting System queried. I discussed results, treatment plan, need for urology follow-up, and return precautions with the patient. Provided opportunity for questions, patient confirmed understanding and is in agreement with plan.    Final Clinical Impression(s) / ED Diagnoses Final diagnoses:  Right flank pain  Kidney stone  Acute UTI    Rx / DC Orders ED Discharge Orders         Ordered    cephALEXin (KEFLEX) 500 MG capsule  4 times daily     11/26/19 1502    ondansetron (ZOFRAN ODT) 4 MG disintegrating tablet     11/26/19 1502    HYDROcodone-acetaminophen (NORCO/VICODIN) 5-325 MG tablet  Every 6 hours PRN     11/26/19 1502           Jacqlyn Larsen, PA-C 11/27/19 Jeri Lager    Quintella Reichert, MD 11/28/19 [id number]

## 2019-11-26 NOTE — ED Triage Notes (Signed)
Pt reports sudden onset of R flank pain this am with nausea. Hx of kidney stones, denies any urinary symptoms, patient diaphoretic in triage. A/ox4.

## 2019-11-27 LAB — URINE CULTURE: Culture: NO GROWTH

## 2020-03-10 ENCOUNTER — Emergency Department (HOSPITAL_COMMUNITY)
Admission: EM | Admit: 2020-03-10 | Discharge: 2020-03-11 | Disposition: A | Discharge status: Home / Self Care | Payer: Self-pay | Attending: Emergency Medicine | Admitting: Emergency Medicine

## 2020-03-10 ENCOUNTER — Other Ambulatory Visit: Payer: Self-pay

## 2020-03-10 ENCOUNTER — Encounter (HOSPITAL_COMMUNITY): Payer: Self-pay

## 2020-03-10 DIAGNOSIS — K0889 Other specified disorders of teeth and supporting structures: Secondary | ICD-10-CM | POA: Insufficient documentation

## 2020-03-10 DIAGNOSIS — Z5321 Procedure and treatment not carried out due to patient leaving prior to being seen by health care provider: Secondary | ICD-10-CM | POA: Insufficient documentation

## 2020-03-10 NOTE — ED Triage Notes (Signed)
Arrived POV from home. Patient reports upper let dental pain. Patient states "almost all of my teeth are bad.

## 2020-03-11 ENCOUNTER — Emergency Department (HOSPITAL_COMMUNITY)
Admission: EM | Admit: 2020-03-11 | Discharge: 2020-03-11 | Disposition: A | Discharge status: Home / Self Care | Payer: Self-pay | Attending: Emergency Medicine | Admitting: Emergency Medicine

## 2020-03-11 ENCOUNTER — Other Ambulatory Visit: Payer: Self-pay

## 2020-03-11 ENCOUNTER — Encounter (HOSPITAL_COMMUNITY): Payer: Self-pay

## 2020-03-11 DIAGNOSIS — R030 Elevated blood-pressure reading, without diagnosis of hypertension: Secondary | ICD-10-CM

## 2020-03-11 DIAGNOSIS — K029 Dental caries, unspecified: Secondary | ICD-10-CM | POA: Insufficient documentation

## 2020-03-11 DIAGNOSIS — K047 Periapical abscess without sinus: Secondary | ICD-10-CM

## 2020-03-11 DIAGNOSIS — F17210 Nicotine dependence, cigarettes, uncomplicated: Secondary | ICD-10-CM | POA: Insufficient documentation

## 2020-03-11 DIAGNOSIS — Z79899 Other long term (current) drug therapy: Secondary | ICD-10-CM | POA: Insufficient documentation

## 2020-03-11 MED ORDER — AMLODIPINE BESYLATE 5 MG PO TABS: 5.0000 mg | ORAL_TABLET | Freq: Once | ORAL | Status: DC

## 2020-03-11 MED ORDER — ACETAMINOPHEN 500 MG PO TABS: 1000.0000 mg | ORAL_TABLET | Freq: Once | ORAL | Status: DC

## 2020-03-11 MED ORDER — AMOXICILLIN 500 MG PO CAPS: 1000.0000 mg | ORAL_CAPSULE | Freq: Once | ORAL | Status: DC

## 2020-03-11 MED ORDER — AMOXICILLIN 500 MG PO CAPS
500.0000 mg | ORAL_CAPSULE | Freq: Three times a day (TID) | ORAL | 0 refills | Status: DC
Start: 2020-03-11 — End: 2023-04-18

## 2020-03-11 MED ORDER — AMLODIPINE BESYLATE 5 MG PO TABS
5.0000 mg | ORAL_TABLET | Freq: Every day | ORAL | 0 refills | Status: DC
Start: 2020-03-11 — End: 2023-04-18

## 2020-03-11 NOTE — Discharge Instructions (Addendum)
It was our pleasure to provide your ER care today - we hope that you feel better.  Your blood pressure is high - follow heart healthy eating plan, limit salt intake, take blood pressure medication as prescribed, and follow up with primary care doctor in 1 week - call office to arrange appointment.   For dental issues/abscess, take antibiotic as prescribed, warm compesses to sore area, take acetaminophen or ibuprofen as need for pain. Follow up with dentist in the next 1-2 days - call office in AM tomorrow to arrange appointment.   Return to ER if worse, new symptoms, fevers, new or worsening facial redness/swelling, severe or intractable pain, chest pain, trouble breathing, or other concern.

## 2020-03-11 NOTE — ED Triage Notes (Addendum)
Pt arrives to ED w/ c/o dental pain x 3 days. Pt states he is concerned he has an infection. Pt reports 8/10 pain. Pt hypertensive in triage and states his BP is always that high but states he has not spoken w/ PCP about it and does not take any medication for HTN.

## 2020-03-11 NOTE — ED Notes (Signed)
RN advised pt to wait in the room so RN could grab medications. RN returned to the room and pt had already left the room and the ED.

## 2020-03-11 NOTE — ED Notes (Signed)
Patient verbalizes understanding of discharge instructions. Opportunity for questioning and answers were provided. Armband removed by staff, pt discharged from ED. Pt. ambulatory and discharged home.  

## 2020-03-11 NOTE — ED Provider Notes (Signed)
[facility] EMERGENCY DEPARTMENT Provider Note   CSN: [id number] Arrival date & time: 03/11/20  1127     History Chief Complaint  Patient presents with  . Dental Pain    Gary Ray is a 34 y.o. male.  Patient c/o dental decay, pain, and swelling. Symptoms acute onset 3 days ago, dull pain in region teeth 8/9, mod-severe, non radiating, worse w chewing. No fever or chills. Has not yet seen dentist for same. Notes multiple other areas of decay/chronic decay. BP is noted high. Notes hx being told same, but never on meds. +fam hx htn. Denies chest pain or sob. No swelling. Normal appetite. No wt change. No severe headache. Denies sore throat. No neck pain or swelling.   The history is provided by the patient.  Dental Pain Associated symptoms: no fever, no headaches and no neck pain        History reviewed. No pertinent past medical history.  There are no problems to display for this patient.   History reviewed. No pertinent surgical history.     No family history on file.  Social History   Tobacco Use  . Smoking status: Current Every Day Smoker    Packs/day: 1.00    Types: Cigarettes  . Smokeless tobacco: Never Used  Substance Use Topics  . Alcohol use: Yes    Comment: SOCIAL  . Drug use: Yes    Types: Marijuana    Home Medications Prior to Admission medications   Medication Sig Start Date End Date Taking? Authorizing Provider  acetaminophen (TYLENOL) 500 MG tablet Take 1,000 mg by mouth every 6 (six) hours as needed for mild pain or headache.    [provider]  cephALEXin (KEFLEX) 500 MG capsule Take 1 capsule (500 mg total) by mouth 4 (four) times daily. 11/26/19   Dartha Lodge, PA-C  HYDROcodone-acetaminophen (NORCO/VICODIN) 5-325 MG tablet Take 1-2 tablets by mouth every 6 (six) hours as needed. 11/26/19   Dartha Lodge, PA-C  ibuprofen (ADVIL) 200 MG tablet Take 800 mg by mouth every 6 (six) hours as needed for headache or mild  pain.    [provider]  ondansetron (ZOFRAN ODT) 4 MG disintegrating tablet 4mg  ODT q4 hours prn nausea/vomit 11/26/19   11/28/19, PA-C    Allergies    Patient has no known allergies.  Review of Systems   Review of Systems  Constitutional: Negative for chills and fever.  HENT: Positive for dental problem. Negative for sore throat.   Eyes: Negative for redness.  Respiratory: Negative for shortness of breath.   Cardiovascular: Negative for chest pain.  Gastrointestinal: Negative for nausea and vomiting.  Genitourinary: Negative for flank pain.  Musculoskeletal: Negative for back pain and neck pain.  Skin: Negative for rash.  Neurological: Negative for weakness, numbness and headaches.  Hematological: Does not bruise/bleed easily.  Psychiatric/Behavioral: Negative for confusion.    Physical Exam Updated Vital Signs BP (!) 201/116 Comment: Sort RN aware of BP  Pulse 93   Temp 99.2 F (37.3 C)   Resp 16   SpO2 94%   Physical Exam Vitals and nursing note reviewed.  Constitutional:      Appearance: Normal appearance. He is well-developed.  HENT:     Head: Atraumatic.     Comments: No facial redness noted. No fluctuance noted.     Nose: Nose normal.     Mouth/Throat:     Mouth: Mucous membranes are moist.     Pharynx: Oropharynx  is clear.     Comments: Multiple dental caries/chronic decay.  Tooth 8/9 pain and tenderness, associated gum swelling, c/w dental abscess. No trismus. Pharynx normal. No swelling, pain or tenderness to floor of mouth or neck.  Eyes:     General: No scleral icterus.    Conjunctiva/sclera: Conjunctivae normal.     Pupils: Pupils are equal, round, and reactive to light.  Neck:     Trachea: No tracheal deviation.  Cardiovascular:     Rate and Rhythm: Normal rate.     Pulses: Normal pulses.  Pulmonary:     Effort: Pulmonary effort is normal. No accessory muscle usage or respiratory distress.  Abdominal:     General: There is no  distension.     Tenderness: There is no abdominal tenderness.  Genitourinary:    Comments: No cva tenderness. Musculoskeletal:        General: No swelling or tenderness.     Cervical back: Normal range of motion and neck supple. No rigidity.     Right lower leg: No edema.     Left lower leg: No edema.  Lymphadenopathy:     Cervical: No cervical adenopathy.  Skin:    General: Skin is warm and dry.     Findings: No rash.  Neurological:     Mental Status: He is alert.     Comments: Alert, speech clear. Steady gait.   Psychiatric:        Mood and Affect: Mood normal.     ED Results / Procedures / Treatments   Labs (all labs ordered are listed, but only abnormal results are displayed) Labs Reviewed - No data to display  EKG None  Radiology No results found.  Procedures Procedures (including critical care time)  Medications Ordered in ED Medications  amLODipine (NORVASC) tablet 5 mg (has no administration in time range)  amoxicillin (AMOXIL) capsule 1,000 mg (has no administration in time range)  acetaminophen (TYLENOL) tablet 1,000 mg (has no administration in time range)    ED Course  I have reviewed the triage vital signs and the nursing notes.  Pertinent labs & imaging results that were available during my care of the patient were reviewed by me and considered in my medical decision making (see chart for details).    MDM Rules/Calculators/A&P                          Confirmed nkda w pt. Amoxicillin po. Acetaminophen po.  Reviewed nursing notes and prior charts for additional history.  On prior visits, bp also elevated, and pt indicates his bp has been in this range several times before - as such, will start on meds, and discussed need pcp f/u.  Also discussed need dental f/u.   Return precautions provided.      Final Clinical Impression(s) / ED Diagnoses Final diagnoses:  None    Rx / DC Orders ED Discharge Orders    None       Cathren Laine,  MD 03/11/20 [phone]

## 2023-04-17 ENCOUNTER — Encounter (HOSPITAL_COMMUNITY): Payer: Self-pay | Admitting: Emergency Medicine

## 2023-04-17 ENCOUNTER — Emergency Department (HOSPITAL_COMMUNITY)
Admission: EM | Admit: 2023-04-17 | Discharge: 2023-04-18 | Disposition: A | Discharge status: Home / Self Care | Payer: PRIVATE HEALTH INSURANCE | Attending: Emergency Medicine | Admitting: Emergency Medicine

## 2023-04-17 DIAGNOSIS — Z79899 Other long term (current) drug therapy: Secondary | ICD-10-CM | POA: Insufficient documentation

## 2023-04-17 DIAGNOSIS — N189 Chronic kidney disease, unspecified: Secondary | ICD-10-CM | POA: Insufficient documentation

## 2023-04-17 DIAGNOSIS — I129 Hypertensive chronic kidney disease with stage 1 through stage 4 chronic kidney disease, or unspecified chronic kidney disease: Secondary | ICD-10-CM | POA: Diagnosis not present

## 2023-04-17 DIAGNOSIS — I10 Essential (primary) hypertension: Secondary | ICD-10-CM

## 2023-04-17 DIAGNOSIS — R519 Headache, unspecified: Secondary | ICD-10-CM | POA: Diagnosis present

## 2023-04-17 LAB — BASIC METABOLIC PANEL
Anion gap: 15 (ref 5–15)
BUN: 13 mg/dL (ref 6–20)
CO2: 22 mmol/L (ref 22–32)
Calcium: 9.5 mg/dL (ref 8.9–10.3)
Chloride: 99 mmol/L (ref 98–111)
Creatinine, Ser: 1.36 mg/dL — ABNORMAL HIGH (ref 0.61–1.24)
GFR, Estimated: >60 mL/min (ref >60)
Glucose, Bld: 110 mg/dL — ABNORMAL HIGH (ref 70–99)
Potassium: 4.5 mmol/L (ref 3.5–5.1)
Sodium: 136 mmol/L (ref 135–145)

## 2023-04-17 LAB — CBC
HCT: 47.9 % (ref 39.0–52.0)
Hemoglobin: 16.6 g/dL (ref 13.0–17.0)
MCH: 32.9 pg (ref 26.0–34.0)
MCHC: 34.7 g/dL (ref 30.0–36.0)
MCV: 95 fL (ref 80.0–100.0)
Platelets: 227 10*3/uL (ref 150–400)
RBC: 5.04 MIL/uL (ref 4.22–5.81)
RDW: 13 % (ref 11.5–15.5)
WBC: 7 10*3/uL (ref 4.0–10.5)
nRBC: 0 % (ref 0.0–0.2)

## 2023-04-17 LAB — TROPONIN I (HIGH SENSITIVITY): Troponin I (High Sensitivity): 11 ng/L (ref <18)

## 2023-04-17 NOTE — ED Triage Notes (Signed)
Pt here from home with c/o htn and chest pressure and h/a , pt ran out of his b/p meds months ago and doesn't have a doctor at this time

## 2023-04-18 MED ORDER — AMLODIPINE BESYLATE 5 MG PO TABS
10.0000 mg | ORAL_TABLET | Freq: Once | ORAL | Status: AC
  Administered 2023-04-18: 10 mg via ORAL
  Filled 2023-04-18: qty 2

## 2023-04-18 MED ORDER — AMLODIPINE BESYLATE 10 MG PO TABS: 10.0000 mg | ORAL_TABLET | Freq: Every day | ORAL | 0 refills | Status: AC

## 2023-04-18 NOTE — ED Provider Notes (Signed)
[place] [facility]GENC[facility] DE[facility]MENT AT [facility] Provider Note   CSN: [id number] Arrival date & time: 04/17/23  1738     History  Chief complaint-hypertension  Gary Ray is a 37 y.o. male.  The history is provided by the patient.  Hypertension This is a chronic problem. The current episode started more than 1 week ago. The problem occurs daily. Associated symptoms include headaches. Nothing relieves the symptoms.  Patient with history of presents with elevated blood pressure and headache.  Patient reports he used to take Norvasc and has been out of this medication for several months.  He reports over the past several days he has noted elevated blood pressure at home, mild chest tightness and mild headache.  No visual changes or slurred speech.  No focal weakness. No previous history of CVA/CAD He now lives in [place] is looking for a local primary care     Home Medications Prior to Admission medications   Medication Sig Start Date End Date Taking? Authorizing Provider  amLODipine (NORVASC) 10 MG tablet Take 1 tablet (10 mg total) by mouth daily. 04/18/23  Yes Zadie Rhine, MD  acetaminophen (TYLENOL) 500 MG tablet Take 1,000 mg by mouth every 6 (six) hours as needed for mild pain or headache.    [provider]      Allergies    Patient has no known allergies.    Review of Systems   Review of Systems  Constitutional:  Negative for fever.  Eyes:  Negative for visual disturbance.  Neurological:  Positive for headaches.    Physical Exam Updated Vital Signs BP (!) 194/129 (BP Location: Right Arm)   Pulse 73   Temp 97.7 F (36.5 C)   Resp 15   SpO2 98%  Physical Exam CONSTITUTIONAL: Well developed/well nourished HEAD: Normocephalic/atraumatic EYES: EOMI/PERRL, no nystagmus, no ptosis ENMT: Mucous membranes moist NECK: supple no meningeal signs SPINE/BACK:entire spine nontender CV: S1/S2 noted, no murmurs/rubs/gallops noted LUNGS: Lungs are  clear to auscultation bilaterally, no apparent distress ABDOMEN: soft, nontender, no rebound or guarding GU:no cva tenderness NEURO:Awake/alert, face symmetric, no arm or leg drift is noted Equal 5/5 strength with shoulder abduction, elbow flex/extension, wrist flex/extension in upper extremities and equal hand grips bilaterally Equal 5/5 strength with hip flexion,knee flex/extension, foot dorsi/plantar flexion Cranial nerves 3/4/5/6/02/05/09/11/12 tested and intact Gait normal without ataxia No past pointing Sensation to light touch intact in all extremities EXTREMITIES: pulses normal, full ROM SKIN: warm, color normal PSYCH: no abnormalities of mood noted, alert and oriented to situation  ED Results / Procedures / Treatments   Labs (all labs ordered are listed, but only abnormal results are displayed) Labs Reviewed  BASIC METABOLIC PANEL - Abnormal; Notable for the following components:      Result Value   Glucose, Bld 110 (*)    Creatinine, Ser 1.36 (*)    All other components within normal limits  CBC  TROPONIN I (HIGH SENSITIVITY)  TROPONIN I (HIGH SENSITIVITY)    EKG EKG Interpretation Date/Time:  Tuesday April 17 2023 18:49:41 EDT Ventricular Rate:  70 PR Interval:  142 QRS Duration:  80 QT Interval:  396 QTC Calculation: 427 R Axis:   97  Text Interpretation: Normal sinus rhythm Rightward axis Borderline ECG No previous ECGs available Confirmed by Zadie Rhine ([id number]) on 04/18/2023 1:22:52 AM  Radiology No results found.  Procedures Procedures    Medications Ordered in ED Medications  amLODipine (NORVASC) tablet 10 mg (has no administration in time range)  ED Course/ Medical Decision Making/ A&P                                 Medical Decision Making Amount and/or Complexity of Data Reviewed Labs: ordered.  Risk Prescription drug management.   This patient presents to the ED for concern of elevated blood pressure, headache, this involves  an extensive number of treatment options, and is a complaint that carries with it a high risk of complications and morbidity.  The differential diagnosis includes but is not limited to subarachnoid hemorrhage, intracranial hemorrhage, meningitis, encephalitis, CVST, temporal arteritis, idiopathic intracranial hypertension, migraine    Comorbidities that complicate the patient evaluation: Patient's presentation is complicated by their history of hypertension  Social Determinants of Health: Patient's lack of prescription access, impaired access to primary care, and tobacco use   increases the complexity of managing their presentation  Additional history obtained: Records reviewed Care Everywhere/External Records  Lab Tests: I Ordered, and personally interpreted labs.  The pertinent results include: Mild renal insufficiency that is chronic   Medicines ordered and prescription drug management: I ordered medication including Norvasc for hypertension   Complexity of problems addressed: Patient's presentation is most consistent with  acute presentation with potential threat to life or bodily function  Disposition: After consideration of the diagnostic results and the patient's response to treatment,  I feel that the patent would benefit from discharge   .    Workup was overall reassuring.  Patient is well-appearing.  Physical exam is unremarkable.  Will restart his BP meds and give referral to PCP       Final Clinical Impression(s) / ED Diagnoses Final diagnoses:  Primary hypertension    Rx / DC Orders ED Discharge Orders          Ordered    amLODipine (NORVASC) 10 MG tablet  Daily        04/18/23 0143              Zadie Rhine, MD 04/18/23 0158
